# Patient Record
Sex: Male | Born: 1955 | Race: White | Hispanic: No | Smoking: Former smoker
Health system: Southern US, Community
[De-identification: ages and names within clinical notes are randomized; demographics above are authoritative.]

## PROBLEM LIST (undated history)

## (undated) DIAGNOSIS — E119 Type 2 diabetes mellitus without complications: Secondary | ICD-10-CM

## (undated) DIAGNOSIS — Z86718 Personal history of other venous thrombosis and embolism: Secondary | ICD-10-CM

## (undated) HISTORY — DX: Type 2 diabetes mellitus without complications: E11.9

## (undated) HISTORY — PX: LEG SURGERY: SHX1003

## (undated) HISTORY — DX: Personal history of other venous thrombosis and embolism: Z86.718

---

## 2018-10-22 ENCOUNTER — Encounter: Payer: Self-pay | Admitting: Family Medicine

## 2018-10-22 ENCOUNTER — Ambulatory Visit (INDEPENDENT_AMBULATORY_CARE_PROVIDER_SITE_OTHER): Payer: Medicare Other | Admitting: Family Medicine

## 2018-10-22 DIAGNOSIS — Z23 Encounter for immunization: Secondary | ICD-10-CM | POA: Diagnosis not present

## 2018-10-22 DIAGNOSIS — Z86718 Personal history of other venous thrombosis and embolism: Secondary | ICD-10-CM | POA: Diagnosis not present

## 2018-10-22 DIAGNOSIS — I10 Essential (primary) hypertension: Secondary | ICD-10-CM

## 2018-10-22 DIAGNOSIS — E1169 Type 2 diabetes mellitus with other specified complication: Secondary | ICD-10-CM | POA: Insufficient documentation

## 2018-10-22 DIAGNOSIS — R7303 Prediabetes: Secondary | ICD-10-CM | POA: Diagnosis not present

## 2018-10-22 DIAGNOSIS — I152 Hypertension secondary to endocrine disorders: Secondary | ICD-10-CM | POA: Insufficient documentation

## 2018-10-22 LAB — BAYER DCA HB A1C WAIVED: HB A1C (BAYER DCA - WAIVED): 6.8 % (ref <7.0)

## 2018-10-22 MED ORDER — RIVAROXABAN 15 MG PO TABS: 15.0000 mg | ORAL_TABLET | Freq: Every day | ORAL | 1 refills | Status: DC

## 2018-10-22 MED ORDER — METFORMIN HCL 1000 MG PO TABS: 1000.0000 mg | ORAL_TABLET | Freq: Every day | ORAL | 3 refills | Status: DC

## 2018-10-22 MED ORDER — LOSARTAN POTASSIUM 50 MG PO TABS: 50.0000 mg | ORAL_TABLET | Freq: Every day | ORAL | 3 refills | Status: DC

## 2018-10-22 NOTE — Progress Notes (Signed)
BP (!) 167/81   Pulse 77   Temp 97.6 F (36.4 C) (Oral)   Ht 5' 10"  (1.778 m)   Wt 249 lb (112.9 kg)   BMI 35.73 kg/m    Subjective:    Patient ID: David Tate, male    DOB: 1956-05-10, 63 y.o.   MRN: [id number]  HPI: David Tate is a 63 y.o. male presenting on 10/22/2018 for New Patient (Initial Visit) (Patient moved from [place]) and [place]   HPI Prediabetes Patient comes in today for recheck of his diabetes. Patient has been currently taking metformin. Patient is currently on an ACE inhibitor/ARB. Patient has not seen an ophthalmologist this year. Patient denies any issues with their feet.   Hypertension Patient is currently on losartan, and their blood pressure today is 167/81. Patient denies any lightheadedness or dizziness. Patient denies headaches, blurred vision, chest pains, shortness of breath, or weakness. Denies any side effects from medication and is content with current medication.   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  He was evaluated for any clotting disorders but was never found to have any specific 1 but just was told that he is going to be on Xarelto for life because of the previous DVTs.  Currently he denies any issues with this or chest pain or shortness of breath or palpitations.  Relevant past medical, surgical, family and social history reviewed and updated as indicated. Interim medical history since our last visit reviewed. Allergies and medications reviewed and updated.  Review of Systems  Constitutional: Negative for chills and fever.  HENT: Negative for ear pain and tinnitus.   Eyes: Negative for pain.  Respiratory: Negative for cough, shortness of breath and wheezing.   Cardiovascular: Negative for chest pain, palpitations and leg swelling.  Gastrointestinal: Negative for abdominal pain, blood in stool, constipation and diarrhea.  Genitourinary: Negative for dysuria  and hematuria.  Musculoskeletal: Negative for back pain and myalgias.  Skin: Negative for rash.  Neurological: Negative for dizziness, weakness and headaches.  Psychiatric/Behavioral: Negative for suicidal ideas.    Per HPI unless specifically indicated above  Social History   Socioeconomic History  . Marital status: Divorced    Spouse name: Not on file  . Number of children: Not on file  . Years of education: Not on file  . Highest education level: Not on file  Occupational History  . Not on file  Social Needs  . Financial resource strain: Not on file  . Food insecurity:    Worry: Not on file    Inability: Not on file  . Transportation needs:    Medical: Not on file    Non-medical: Not on file  Tobacco Use  . Smoking status: Former Smoker    Packs/day: 1.00    Years: 50.00    Pack years: 50.00    Types: Cigarettes    Last attempt to quit: 01/2017    Years since quitting: 1.7  . Smokeless tobacco: Never Used  . Tobacco comment: e cigarrettes now and coming down off of them  Substance and Sexual Activity  . Alcohol use: Yes    Comment: occ  . Drug use: Never  . Sexual activity: Yes    Comment: married 3.5 years  Lifestyle  . Physical activity:    Days per week: Not on file    Minutes per session: Not on file  . Stress: Not on file  Relationships  . Social connections:    Talks on phone: Not on file    Gets together: Not on file    Attends religious service: Not on file    Active member of club or organization: Not on file    Attends meetings of clubs or organizations: Not on file    Relationship status: Not on file  . Intimate partner violence:    Fear of current or ex partner: Not on file    Emotionally abused: Not on file    Physically abused: Not on file    Forced sexual activity: Not on file  Other Topics Concern  . Not on file  Social History Narrative  . Not on file    Past Surgical History:  Procedure Laterality Date  . LEG SURGERY Left      Family History  Problem Relation Age of Onset  . COPD Mother     Allergies as of 10/22/2018      Reactions   Lisinopril Cough      Medication List       Accurate as of October 22, 2018 10:25 AM. Always use your most recent med list.        losartan 50 MG tablet Commonly known as:  COZAAR Take 1 tablet (50 mg total) by mouth daily.   metFORMIN 1000 MG tablet Commonly known as:  GLUCOPHAGE Take 1 tablet (1,000 mg total) by mouth daily.   Rivaroxaban 15 MG Tabs tablet Commonly known as:  XARELTO Take 1 tablet (15 mg total) by mouth daily with supper.          Objective:    BP (!) 167/81   Pulse 77   Temp 97.6 F (36.4 C) (Oral)   Ht 5' 10"  (1.778 m)   Wt 249 lb (112.9 kg)   BMI 35.73 kg/m   Wt Readings from Last 3 Encounters:  10/22/18 249 lb (112.9 kg)    Physical Exam Vitals signs and nursing note reviewed.  Constitutional:      General: He is not in acute distress.    Appearance: He is well-developed. He is not diaphoretic.  HENT:     Right Ear: External ear normal.     Left Ear: External ear normal.     Nose: Nose normal.     Mouth/Throat:     Pharynx: No oropharyngeal exudate.  Eyes:     General: No scleral icterus.       Right eye: No discharge.     Conjunctiva/sclera: Conjunctivae normal.     Pupils: Pupils are equal, round, and reactive to light.  Neck:     Musculoskeletal: Neck supple.     Thyroid: No thyromegaly.  Cardiovascular:     Rate and Rhythm: Normal rate and regular rhythm.     Heart sounds: Normal heart sounds. No murmur.  Pulmonary:     Effort: Pulmonary effort is normal. No respiratory distress.     Breath sounds: Normal breath sounds. No wheezing.  Abdominal:     General: Abdomen is flat. Bowel sounds are normal. There is no distension.     Palpations: Abdomen is soft.     Tenderness: There is no abdominal tenderness. There is no guarding or rebound.  Musculoskeletal: Normal range of motion.  Lymphadenopathy:      Cervical: No cervical adenopathy.  Skin:    General: Skin is warm and dry.     Findings: No rash.  Neurological:     Mental Status: He is alert  and oriented to person, place, and time.     Coordination: Coordination normal.  Psychiatric:        Behavior: Behavior normal.     No results found for this or any previous visit.    Assessment & Plan:   Problem List Items Addressed This Visit      Cardiovascular and Mediastinum   Hypertension   Relevant Medications   Rivaroxaban (XARELTO) 15 MG TABS tablet   Other Relevant Orders   CBC with Differential/Platelet (Completed)   CMP14+EGFR (Completed)   BMP8+EGFR     Other   Prediabetes   Relevant Orders   Bayer DCA Hb A1c Waived (Completed)   History of DVT (deep vein thrombosis)   Relevant Orders   CBC with Differential/Platelet (Completed)   Morbid obesity (HCC)   Relevant Medications   metFORMIN (GLUCOPHAGE) 1000 MG tablet   Other Relevant Orders   Lipid panel (Completed)    Other Visit Diagnoses    Encounter for immunization       Relevant Orders   Flu vaccine HIGH DOSE PF (Completed)      Patient does have morbid obesity and weight and lifestyle changes were discussed and patient will try to make some changes  As a new patient we will continue his Xarelto and his metformin we will check his A1c and other labs today. Follow up plan: Return in about 3 months (around 01/21/2019), or if symptoms worsen or fail to improve, for Diabetes.  Caryl Pina, MD [facility] 10/22/2018, 10:25 AM

## 2018-10-22 NOTE — Patient Instructions (Signed)
Return in 4 weeks for lab draw and 3 months for regular check

## 2018-10-23 LAB — LIPID PANEL
Chol/HDL Ratio: 5.6 ratio — ABNORMAL HIGH (ref 0.0–5.0)
Cholesterol, Total: 196 mg/dL (ref 100–199)
HDL: 35 mg/dL — AB (ref >39)
LDL Calculated: 112 mg/dL — ABNORMAL HIGH (ref 0–99)
Triglycerides: 246 mg/dL — ABNORMAL HIGH (ref 0–149)
VLDL Cholesterol Cal: 49 mg/dL — ABNORMAL HIGH (ref 5–40)

## 2018-10-23 LAB — CMP14+EGFR
ALT: 37 IU/L (ref 0–44)
AST: 27 IU/L (ref 0–40)
Albumin/Globulin Ratio: 1.5 (ref 1.2–2.2)
Albumin: 4.5 g/dL (ref 3.6–4.8)
Alkaline Phosphatase: 109 IU/L (ref 39–117)
BUN/Creatinine Ratio: 13 (ref 10–24)
BUN: 14 mg/dL (ref 8–27)
Bilirubin Total: 0.3 mg/dL (ref 0.0–1.2)
CALCIUM: 10.2 mg/dL (ref 8.6–10.2)
CO2: 22 mmol/L (ref 20–29)
Chloride: 100 mmol/L (ref 96–106)
Creatinine, Ser: 1.1 mg/dL (ref 0.76–1.27)
GFR calc Af Amer: 81 mL/min/{1.73_m2} (ref >59)
GFR, EST NON AFRICAN AMERICAN: 70 mL/min/{1.73_m2} (ref >59)
Globulin, Total: 3.1 g/dL (ref 1.5–4.5)
Glucose: 114 mg/dL — ABNORMAL HIGH (ref 65–99)
Potassium: 5 mmol/L (ref 3.5–5.2)
Sodium: 140 mmol/L (ref 134–144)
Total Protein: 7.6 g/dL (ref 6.0–8.5)

## 2018-10-23 LAB — CBC WITH DIFFERENTIAL/PLATELET
Basophils Absolute: 0.2 10*3/uL (ref 0.0–0.2)
Basos: 1 %
EOS (ABSOLUTE): 0.2 10*3/uL (ref 0.0–0.4)
Eos: 1 %
Hematocrit: 47.7 % (ref 37.5–51.0)
Hemoglobin: 16.9 g/dL (ref 13.0–17.7)
Immature Grans (Abs): 0.7 10*3/uL — ABNORMAL HIGH (ref 0.0–0.1)
Immature Granulocytes: 4 %
Lymphocytes Absolute: 4.5 10*3/uL — ABNORMAL HIGH (ref 0.7–3.1)
Lymphs: 25 %
MCH: 29.4 pg (ref 26.6–33.0)
MCHC: 35.4 g/dL (ref 31.5–35.7)
MCV: 83 fL (ref 79–97)
Monocytes Absolute: 1.1 10*3/uL — ABNORMAL HIGH (ref 0.1–0.9)
Monocytes: 6 %
Neutrophils Absolute: 11.2 10*3/uL — ABNORMAL HIGH (ref 1.4–7.0)
Neutrophils: 63 %
Platelets: 338 10*3/uL (ref 150–450)
RBC: 5.74 x10E6/uL (ref 4.14–5.80)
RDW: 12.3 % (ref 11.6–15.4)
WBC: 18 10*3/uL — ABNORMAL HIGH (ref 3.4–10.8)

## 2018-10-26 ENCOUNTER — Telehealth: Payer: Self-pay | Admitting: *Deleted

## 2018-10-26 NOTE — Telephone Encounter (Signed)
Fax from [facility] Losartan 50 mg is on backorder Request alternative 100 mg 1/2 tab QD Please advise and send in new Rx

## 2018-10-27 ENCOUNTER — Other Ambulatory Visit: Payer: Self-pay | Admitting: *Deleted

## 2018-10-27 ENCOUNTER — Encounter: Payer: Self-pay | Admitting: *Deleted

## 2018-10-27 MED ORDER — LOSARTAN POTASSIUM 100 MG PO TABS: 50.0000 mg | ORAL_TABLET | Freq: Every day | ORAL | 1 refills | Status: DC

## 2018-10-27 NOTE — Telephone Encounter (Signed)
Yes we can go ahead and send in the 100 mg so he can half it.  Give him a months worth of that and hopefully will be back next month

## 2018-10-27 NOTE — Telephone Encounter (Signed)
Phone number for patient does not work.  Letter mailed explaining change in losartan script.  New script is 100 mg , take I/2 pill daily.

## 2018-10-29 ENCOUNTER — Encounter: Payer: Self-pay | Admitting: *Deleted

## 2018-11-05 ENCOUNTER — Other Ambulatory Visit: Payer: Self-pay

## 2018-11-05 MED ORDER — PRAVASTATIN SODIUM 20 MG PO TABS: 20.0000 mg | ORAL_TABLET | Freq: Every day | ORAL | 1 refills | Status: DC

## 2018-12-03 ENCOUNTER — Other Ambulatory Visit: Payer: Medicare Other

## 2018-12-03 DIAGNOSIS — I10 Essential (primary) hypertension: Secondary | ICD-10-CM

## 2018-12-04 LAB — BMP8+EGFR
BUN/Creatinine Ratio: 17 (ref 10–24)
BUN: 19 mg/dL (ref 8–27)
CO2: 18 mmol/L — ABNORMAL LOW (ref 20–29)
Calcium: 9.5 mg/dL (ref 8.6–10.2)
Chloride: 100 mmol/L (ref 96–106)
Creatinine, Ser: 1.15 mg/dL (ref 0.76–1.27)
GFR calc Af Amer: 77 mL/min/{1.73_m2} (ref >59)
GFR, EST NON AFRICAN AMERICAN: 66 mL/min/{1.73_m2} (ref >59)
Glucose: 163 mg/dL — ABNORMAL HIGH (ref 65–99)
Potassium: 4.5 mmol/L (ref 3.5–5.2)
Sodium: 140 mmol/L (ref 134–144)

## 2018-12-09 ENCOUNTER — Ambulatory Visit (INDEPENDENT_AMBULATORY_CARE_PROVIDER_SITE_OTHER): Payer: Medicare Other | Admitting: Family Medicine

## 2018-12-09 ENCOUNTER — Encounter: Payer: Self-pay | Admitting: Family Medicine

## 2018-12-09 VITALS — BP 132/76 | HR 76 | Temp 97.0°F | Ht 70.0 in | Wt 260.4 lb

## 2018-12-09 DIAGNOSIS — Z0000 Encounter for general adult medical examination without abnormal findings: Secondary | ICD-10-CM

## 2018-12-09 DIAGNOSIS — Z1211 Encounter for screening for malignant neoplasm of colon: Secondary | ICD-10-CM

## 2018-12-09 NOTE — Progress Notes (Signed)
Subjective:   David Tate is a 63 y.o. male who presents for a Welcome to Medicare exam.   Review of Systems: The 10-year ASCVD risk score Mikey Bussing DC Brooke Bonito., et al., 2013) is: 18.7%   Values used to calculate the score:     Age: 46 years     Sex: Male     Is Non-Hispanic African American: No     Diabetic: No     Tobacco smoker: No     Systolic Blood Pressure: 643 mmHg     Is BP treated: Yes     HDL Cholesterol: 35 mg/dL     Total Cholesterol: 196 mg/dL  Cardiac Risk Factors include: advanced age (>66men, >43 women);diabetes mellitus;obesity (BMI >30kg/m2);hypertension;male gender     Objective:    Today's Vitals   12/09/18 1009  BP: 132/76  Pulse: 76  Temp: (!) 97 F (36.1 C)  TempSrc: Oral  Weight: 260 lb 6.4 oz (118.1 kg)  Height: 5\' 10"  (1.778 m)   Body mass index is 37.36 kg/m.  Medications Outpatient Encounter Medications as of 12/09/2018  Medication Sig  . losartan (COZAAR) 100 MG tablet Take 0.5 tablets (50 mg total) by mouth daily.  . metFORMIN (GLUCOPHAGE) 1000 MG tablet Take 1 tablet (1,000 mg total) by mouth daily.  . pravastatin (PRAVACHOL) 20 MG tablet Take 1 tablet (20 mg total) by mouth daily.  . Rivaroxaban (XARELTO) 15 MG TABS tablet Take 1 tablet (15 mg total) by mouth daily with supper.   No facility-administered encounter medications on file as of 12/09/2018.      History: Past Medical History:  Diagnosis Date  . Diabetes mellitus without complication ([place])   . H/O blood clots    Past Surgical History:  Procedure Laterality Date  . LEG SURGERY Left     Family History  Problem Relation Age of Onset  . COPD Mother    Social History   Occupational History  . Not on file  Tobacco Use  . Smoking status: Former Smoker    Packs/day: 1.00    Years: 50.00    Pack years: 50.00    Types: Cigarettes    Last attempt to quit: 01/2017    Years since quitting: 1.9  . Smokeless tobacco: Never Used  . Tobacco comment: e cigarrettes now and coming  down off of them  Substance and Sexual Activity  . Alcohol use: Yes    Comment: occ  . Drug use: Never  . Sexual activity: Yes    Comment: married 3.5 years   Tobacco Counseling Counseling given: Not Answered Comment: e cigarrettes now and coming down off of them   Immunizations and Health Maintenance Immunization History  Administered Date(s) Administered  . Influenza, High Dose Seasonal PF 10/22/2018   Health Maintenance Due  Topic Date Due  . Hepatitis C Screening  1955-11-20  . HIV Screening  10/10/1968  . TETANUS/TDAP  10/10/1972  . COLONOSCOPY  10/11/2003  . PNA vac Low Risk Adult (1 of 2 - PCV13) 10/10/2018    Activities of Daily Living In your present state of health, do you have any difficulty performing the following activities: 12/09/2018  Hearing? N  Vision? N  Difficulty concentrating or making decisions? N  Walking or climbing stairs? Y  Comment lower back pain  Dressing or bathing? N  Doing errands, shopping? N  Preparing Food and eating ? N  Using the Toilet? N  In the past six months, have you accidently leaked urine? N  Do you have problems with loss of bowel control? N  Managing your Medications? N  Managing your Finances? N  Housekeeping or managing your Housekeeping? N    Physical Exam  (optional), or other factors deemed appropriate based on the beneficiary's medical and social history and current clinical standards.  Advanced Directives: Does Patient Have a Medical Advance Directive?: No    Assessment:    This is a routine wellness  examination for this patient .   Vision/Hearing screen No exam data present  Dietary issues and exercise activities discussed:  Current Exercise Habits: Home exercise routine, Type of exercise: walking, Time (Minutes): 30, Frequency (Times/Week): 7, Weekly Exercise (Minutes/Week): 210, Intensity: Moderate  Goals   None     Depression Screen PHQ 2/9 Scores 12/09/2018 10/22/2018  PHQ - 2 Score 0 0      Fall Risk No flowsheet data found.  Cognitive Function MMSE - Mini Mental State Exam 12/09/2018  Orientation to time 5  Orientation to Place 4  Registration 3  Attention/ Calculation 5  Recall 1  Language- name 2 objects 2  Language- repeat 1  Language- follow 3 step command 1  Language- read & follow direction 1  Write a sentence 1  Copy design 1  Total score 25        Patient Care Team: Tavita Eastham, Fransisca Kaufmann, MD as PCP - General (Family Medicine)     Plan:     Problem List Items Addressed This Visit    None    Visit Diagnoses    Encounter for Medicare annual wellness exam    -  Primary   Colon cancer screening       Relevant Orders   Cologuard       I have personally reviewed and noted the following in the patient's chart:   . Medical and social history . Use of alcohol, tobacco or illicit drugs  . Current medications and supplements . Functional ability and status . Nutritional status . Physical activity . Advanced directives . List of other physicians . Hospitalizations, surgeries, and ER visits in previous 12 months . Vitals . Screenings to include cognitive, depression, and falls . Referrals and appointments  In addition, I have reviewed and discussed with patient certain preventive protocols, quality metrics, and best practice recommendations. A written personalized care plan for preventive services as well as general preventive health recommendations were provided to patient.    Worthy Rancher, MD 12/09/2018

## 2018-12-21 DIAGNOSIS — Z1211 Encounter for screening for malignant neoplasm of colon: Secondary | ICD-10-CM | POA: Diagnosis not present

## 2018-12-21 DIAGNOSIS — Z1212 Encounter for screening for malignant neoplasm of rectum: Secondary | ICD-10-CM | POA: Diagnosis not present

## 2018-12-24 LAB — COLOGUARD: Cologuard: NEGATIVE

## 2019-01-26 ENCOUNTER — Other Ambulatory Visit: Payer: Self-pay

## 2019-01-26 ENCOUNTER — Ambulatory Visit (INDEPENDENT_AMBULATORY_CARE_PROVIDER_SITE_OTHER): Payer: Medicare Other | Admitting: Family Medicine

## 2019-01-26 ENCOUNTER — Encounter: Payer: Self-pay | Admitting: Family Medicine

## 2019-01-26 DIAGNOSIS — F410 Panic disorder [episodic paroxysmal anxiety] without agoraphobia: Secondary | ICD-10-CM

## 2019-01-26 DIAGNOSIS — R7303 Prediabetes: Secondary | ICD-10-CM | POA: Diagnosis not present

## 2019-01-26 DIAGNOSIS — E1169 Type 2 diabetes mellitus with other specified complication: Secondary | ICD-10-CM

## 2019-01-26 DIAGNOSIS — I10 Essential (primary) hypertension: Secondary | ICD-10-CM

## 2019-01-26 DIAGNOSIS — Z86718 Personal history of other venous thrombosis and embolism: Secondary | ICD-10-CM

## 2019-01-26 DIAGNOSIS — E785 Hyperlipidemia, unspecified: Secondary | ICD-10-CM | POA: Diagnosis not present

## 2019-01-26 MED ORDER — METFORMIN HCL 1000 MG PO TABS: 1000.0000 mg | ORAL_TABLET | Freq: Every day | ORAL | 3 refills | Status: DC

## 2019-01-26 MED ORDER — HYDROXYZINE HCL 25 MG PO TABS: 25.0000 mg | ORAL_TABLET | Freq: Three times a day (TID) | ORAL | 1 refills | Status: DC | PRN

## 2019-01-26 MED ORDER — PRAVASTATIN SODIUM 20 MG PO TABS: 20.0000 mg | ORAL_TABLET | Freq: Every day | ORAL | 1 refills | Status: DC

## 2019-01-26 MED ORDER — LOSARTAN POTASSIUM 50 MG PO TABS: 100.0000 mg | ORAL_TABLET | Freq: Every day | ORAL | 3 refills | Status: DC

## 2019-01-26 MED ORDER — RIVAROXABAN 15 MG PO TABS: 15.0000 mg | ORAL_TABLET | Freq: Every day | ORAL | 1 refills | Status: DC

## 2019-01-26 MED ORDER — METFORMIN HCL 1000 MG PO TABS: 1000.0000 mg | ORAL_TABLET | Freq: Two times a day (BID) | ORAL | 3 refills | Status: DC

## 2019-01-26 NOTE — Progress Notes (Signed)
Virtual Visit via telephone Note  I connected with David Tate on 01/26/19 at 0941 by telephone and verified that I am speaking with the correct person using two identifiers. David Tate is currently located at home and wife are currently with her during visit. The provider, Fransisca Kaufmann Calloway Andrus, MD is located in their office at time of visit.  Call ended at 1002  I discussed the limitations, risks, security and privacy concerns of performing an evaluation and management service by telephone and the availability of in person appointments. I also discussed with the patient that there may be a patient responsible charge related to this service. The patient expressed understanding and agreed to proceed.   History and Present Illness: Hypertension Patient is currently on losartan 50, and their blood pressure today is 135/81 and more into 676'H systolic. Patient denies any lightheadedness or dizziness. Patient denies headaches, blurred vision, chest pains, shortness of breath, or weakness. Denies any side effects from medication and is content with current medication.   Prediabetes Patient comes in today for recheck of his diabetes. Patient has been currently taking metformin. Patient is currently on an ACE inhibitor/ARB. Patient has not seen an ophthalmologist this year. Patient denies any issues with their feet.   Hyperlipidemia Patient is coming in for recheck of his hyperlipidemia. The patient is currently taking pravastatin. They deny any issues with myalgias or history of liver damage from it. They deny any focal numbness or weakness or chest pain.   Anxiety  Patient has been feeling anxiety and he will be relaxing and commonly occurs at rest. Patient denies chest pain.  It will occur at rest.  Patient says the anxiety will build up and then he feels little bit or short of breath with it and then it will pass and then will build up again and symptoms will happen a few times in the day and then  he will go days or week without it.  He says a lot of it he feels is related to the coronavirus building up currently.  Patient denies any suicidal ideations or thoughts of hurting himself.  No diagnosis found.  Outpatient Encounter Medications as of 01/26/2019  Medication Sig  . losartan (COZAAR) 100 MG tablet Take 0.5 tablets (50 mg total) by mouth daily.  . metFORMIN (GLUCOPHAGE) 1000 MG tablet Take 1 tablet (1,000 mg total) by mouth daily.  . pravastatin (PRAVACHOL) 20 MG tablet Take 1 tablet (20 mg total) by mouth daily.  . Rivaroxaban (XARELTO) 15 MG TABS tablet Take 1 tablet (15 mg total) by mouth daily with supper.   No facility-administered encounter medications on file as of 01/26/2019.     Review of Systems  Constitutional: Negative for chills and fever.  Respiratory: Positive for shortness of breath. Negative for wheezing.   Cardiovascular: Negative for chest pain and leg swelling.  Musculoskeletal: Negative for back pain and gait problem.  Skin: Negative for rash.  Neurological: Negative for dizziness, weakness and light-headedness.  Psychiatric/Behavioral: Positive for sleep disturbance. Negative for decreased concentration, dysphoric mood, self-injury and suicidal ideas. The patient is nervous/anxious.   All other systems reviewed and are negative.   Observations/Objective: Patient sounds comfortable and in no acute distress  Assessment and Plan: Problem List Items Addressed This Visit      Cardiovascular and Mediastinum   Hypertension   Relevant Medications   losartan (COZAAR) 50 MG tablet   pravastatin (PRAVACHOL) 20 MG tablet   Rivaroxaban (XARELTO) 15 MG TABS tablet  Other   Prediabetes - Primary   Relevant Medications   metFORMIN (GLUCOPHAGE) 1000 MG tablet   History of DVT (deep vein thrombosis)   Relevant Medications   Rivaroxaban (XARELTO) 15 MG TABS tablet    Other Visit Diagnoses    Hyperlipidemia associated with type 2 diabetes mellitus (HCC)        Relevant Medications   losartan (COZAAR) 50 MG tablet   pravastatin (PRAVACHOL) 20 MG tablet   metFORMIN (GLUCOPHAGE) 1000 MG tablet   Anxiety attack       Relevant Medications   hydrOXYzine (ATARAX/VISTARIL) 25 MG tablet       Follow Up Instructions: Will start hydroxyzine as needed for anxiety.  Continue his metformin and pravastatin and Xarelto.  Increased his losartan to 100 mg daily at 2 tablets and he will monitor closely at home  Follow-up in 3 months    I discussed the assessment and treatment plan with the patient. The patient was provided an opportunity to ask questions and all were answered. The patient agreed with the plan and demonstrated an understanding of the instructions.   The patient was advised to call back or seek an in-person evaluation if the symptoms worsen or if the condition fails to improve as anticipated.  The above assessment and management plan was discussed with the patient. The patient verbalized understanding of and has agreed to the management plan. Patient is aware to call the clinic if symptoms persist or worsen. Patient is aware when to return to the clinic for a follow-up visit. Patient educated on when it is appropriate to go to the emergency department.    I provided 21 minutes of non-face-to-face time during this encounter.    Worthy Rancher, MD

## 2019-03-14 ENCOUNTER — Other Ambulatory Visit: Payer: Self-pay

## 2019-03-14 ENCOUNTER — Other Ambulatory Visit: Payer: Self-pay | Admitting: Family Medicine

## 2019-03-14 ENCOUNTER — Ambulatory Visit (HOSPITAL_COMMUNITY)
Admission: RE | Admit: 2019-03-14 | Discharge: 2019-03-14 | Disposition: A | Discharge status: Home / Self Care | Payer: Medicare Other | Source: Ambulatory Visit | Attending: Family Medicine | Admitting: Family Medicine

## 2019-03-14 ENCOUNTER — Encounter: Payer: Self-pay | Admitting: Family Medicine

## 2019-03-14 ENCOUNTER — Ambulatory Visit (INDEPENDENT_AMBULATORY_CARE_PROVIDER_SITE_OTHER): Payer: Medicare Other | Admitting: Family Medicine

## 2019-03-14 VITALS — BP 148/79 | HR 79 | Temp 97.7°F | Ht 70.0 in | Wt 262.8 lb

## 2019-03-14 DIAGNOSIS — R600 Localized edema: Secondary | ICD-10-CM | POA: Diagnosis not present

## 2019-03-14 DIAGNOSIS — R2242 Localized swelling, mass and lump, left lower limb: Secondary | ICD-10-CM

## 2019-03-14 DIAGNOSIS — L03116 Cellulitis of left lower limb: Secondary | ICD-10-CM

## 2019-03-14 MED ORDER — DOXYCYCLINE HYCLATE 100 MG PO TABS: 100.0000 mg | ORAL_TABLET | Freq: Two times a day (BID) | ORAL | 0 refills | Status: DC

## 2019-03-14 NOTE — Patient Instructions (Signed)

## 2019-03-14 NOTE — Progress Notes (Signed)
Subjective:  Patient ID: Cartrell Bentsen, male    DOB: 14-May-1956, 63 y.o.   MRN: [id number]  Chief Complaint:  Leg Swelling (left lower leg- x 3 days. Also c/o hot to touch and painful . 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 male presenting on 03/14/2019 for Leg Swelling (left lower leg- x 3 days. Also c/o hot to touch and painful . States he has hx of DVT)  Pt presents today with complaints of left lower extremity swelling, tenderness, erythema, and increased warmth. Pt states this started on Friday. No known injury. No recent long travel. Does have a sedentary job. No fever, chills, weakness, palpitations, cough, chest pain, shortness of breath, dizziness, or syncope.   Relevant past medical, surgical, family, and social history reviewed and updated as indicated.  Allergies and medications reviewed and updated.   Past Medical History:  Diagnosis Date  . Diabetes mellitus without complication ([place])   . H/O blood clots     Past Surgical History:  Procedure Laterality Date  . LEG SURGERY Left     Social History   Socioeconomic History  . Marital status: Divorced    Spouse name: Not on file  . Number of children: Not on file  . Years of education: Not on file  . Highest education level: Not on file  Occupational History  . Not on file  Social Needs  . Financial resource strain: Not on file  . Food insecurity:    Worry: Not on file    Inability: Not on file  . Transportation needs:    Medical: Not on file    Non-medical: Not on file  Tobacco Use  . Smoking status: Former Smoker    Packs/day: 1.00    Years: 50.00    Pack years: 50.00    Types: Cigarettes    Last attempt to quit: 01/2017    Years since quitting: 2.1  . Smokeless tobacco: Never Used  . Tobacco comment: e cigarrettes now and coming down off of them  Substance and Sexual Activity  . Alcohol use: Yes    Comment: occ  . Drug use: Never  . Sexual activity: Yes    Comment: married 3.5  years  Lifestyle  . Physical activity:    Days per week: Not on file    Minutes per session: Not on file  . Stress: Not on file  Relationships  . Social connections:    Talks on phone: Not on file    Gets together: Not on file    Attends religious service: Not on file    Active member of club or organization: Not on file    Attends meetings of clubs or organizations: Not on file    Relationship status: Not on file  . Intimate partner violence:    Fear of current or ex partner: Not on file    Emotionally abused: Not on file    Physically abused: Not on file    Forced sexual activity: Not on file  Other Topics Concern  . Not on file  Social History Narrative  . Not on file    Outpatient Encounter Medications as of 03/14/2019  Medication Sig  . hydrOXYzine (ATARAX/VISTARIL) 25 MG tablet Take 1 tablet (25 mg total) by mouth 3 (three) times daily as needed for anxiety.  Marland Kitchen losartan (COZAAR) 50 MG tablet Take 2 tablets (100 mg total) by mouth daily.  . metFORMIN (GLUCOPHAGE) 1000 MG  tablet Take 1 tablet (1,000 mg total) by mouth daily with breakfast.  . pravastatin (PRAVACHOL) 20 MG tablet Take 1 tablet (20 mg total) by mouth daily.  . Rivaroxaban (XARELTO) 15 MG TABS tablet Take 1 tablet (15 mg total) by mouth daily with supper.  . doxycycline (VIBRA-TABS) 100 MG tablet Take 1 tablet (100 mg total) by mouth 2 (two) times daily for 7 days. 1 po bid   No facility-administered encounter medications on file as of 03/14/2019.     Allergies  Allergen Reactions  . Lisinopril Cough    Review of Systems  Constitutional: Negative for chills, diaphoresis, fatigue and fever.  Respiratory: Negative for cough, chest tightness, shortness of breath and wheezing.   Cardiovascular: Positive for leg swelling. Negative for chest pain and palpitations.  Gastrointestinal: Negative for abdominal distention.  Genitourinary: Negative for decreased urine volume.  Musculoskeletal: Negative for  arthralgias, joint swelling and myalgias.  Neurological: Negative for dizziness, syncope, weakness, light-headedness and headaches.  Psychiatric/Behavioral: Negative for confusion.  All other systems reviewed and are negative.       Objective:  BP (!) 148/79   Pulse 79   Temp 97.7 F (36.5 C) (Oral)   Ht 5\' 10"  (1.778 m)   Wt 262 lb 12.8 oz (119.2 kg)   BMI 37.71 kg/m    Wt Readings from Last 3 Encounters:  03/14/19 262 lb 12.8 oz (119.2 kg)  12/09/18 260 lb 6.4 oz (118.1 kg)  10/22/18 249 lb (112.9 kg)    Physical Exam Vitals signs and nursing note reviewed.  Constitutional:      General: He is not in acute distress.    Appearance: Normal appearance. He is well-developed and well-groomed. He is not ill-appearing, toxic-appearing or diaphoretic.  HENT:     Head: Normocephalic and atraumatic.     Jaw: There is normal jaw occlusion.     Right Ear: Hearing normal.     Left Ear: Hearing normal.     Nose: Nose normal.     Mouth/Throat:     Lips: Pink.     Mouth: Mucous membranes are moist.     Pharynx: Oropharynx is clear. Uvula midline.  Eyes:     General: Lids are normal.     Extraocular Movements: Extraocular movements intact.     Conjunctiva/sclera: Conjunctivae normal.     Pupils: Pupils are equal, round, and reactive to light.  Neck:     Musculoskeletal: Normal range of motion and neck supple.     Thyroid: No thyroid mass, thyromegaly or thyroid tenderness.     Vascular: No carotid bruit or JVD.     Trachea: Trachea and phonation normal.  Cardiovascular:     Rate and Rhythm: Normal rate and regular rhythm.     Chest Wall: PMI is not displaced.     Pulses: Normal pulses.     Heart sounds: Normal heart sounds. No murmur. No friction rub. No gallop.      Comments: Right calf 38 cm Left calf 41.5 cm Pulmonary:     Effort: Pulmonary effort is normal. No respiratory distress.     Breath sounds: Normal breath sounds. No wheezing.  Abdominal:     General: Bowel  sounds are normal. There is no distension or abdominal bruit.     Palpations: Abdomen is soft. There is no hepatomegaly or splenomegaly.     Tenderness: There is no abdominal tenderness. There is no right CVA tenderness or left CVA tenderness.     Hernia: No  hernia is present.  Musculoskeletal: Normal range of motion.     Right lower leg: No edema.     Left lower leg: 2+ Edema present.  Lymphadenopathy:     Cervical: No cervical adenopathy.  Skin:    General: Skin is warm and dry.     Capillary Refill: Capillary refill takes less than 2 seconds.     Coloration: Skin is not cyanotic, jaundiced or pale.     Findings: Erythema and wound present. No rash.       Neurological:     General: No focal deficit present.     Mental Status: He is alert and oriented to person, place, and time.     Cranial Nerves: Cranial nerves are intact.     Sensory: Sensation is intact.     Motor: Motor function is intact.     Coordination: Coordination is intact.     Gait: Gait is intact.     Deep Tendon Reflexes: Reflexes are normal and symmetric.  Psychiatric:        Attention and Perception: Attention and perception normal.        Mood and Affect: Mood and affect normal.        Speech: Speech normal.        Behavior: Behavior normal. Behavior is cooperative.        Thought Content: Thought content normal.        Cognition and Memory: Cognition and memory normal.        Judgment: Judgment normal.     Results for orders placed or performed in visit on 12/28/18  Cologuard  Result Value Ref Range   Cologuard Negative Negative       Pertinent labs & imaging results that were available during my care of the patient were reviewed by me and considered in my medical decision making.  Assessment & Plan:  Marcques was seen today for leg swelling.  Diagnoses and all orders for this visit:  Localized swelling of left lower extremity Likely cellulitis but due to the size differences in calf circumference  and history of DVT, will get [place].  -     Cancel: VAS [place] LOWER EXTREMITY VENOUS (DVT); Future -     US Venous Img Lower Unilateral Left; Future  Cellulitis of left lower extremity Erythema, swelling, and increased warmth to left lower extremity. Pt does have a small abrasion to anterior shin, likely point of entry. Will treat with below. Symptomatic care discussed. Medications as prescribed. Report any new or worsening symptoms. Follow up in 1 week for reevaluation.  -     doxycycline (VIBRA-TABS) 100 MG tablet; Take 1 tablet (100 mg total) by mouth 2 (two) times daily for 7 days. 1 po bid     Continue all other maintenance medications.  Follow up plan: Return in about 1 week (around 03/21/2019), or if symptoms worsen or fail to improve.  Educational handout given for cellulitis  The above assessment and management plan was discussed with the patient. The patient verbalized understanding of and has agreed to the management plan. Patient is aware to call the clinic if symptoms persist or worsen. Patient is aware when to return to the clinic for a follow-up visit. Patient educated on when it is appropriate to go to the emergency department.   Monia Pouch, FNP-C [facility] [phone]

## 2019-03-21 ENCOUNTER — Ambulatory Visit (INDEPENDENT_AMBULATORY_CARE_PROVIDER_SITE_OTHER): Payer: Medicare Other | Admitting: Family Medicine

## 2019-03-21 ENCOUNTER — Other Ambulatory Visit: Payer: Self-pay

## 2019-03-21 ENCOUNTER — Encounter: Payer: Self-pay | Admitting: Family Medicine

## 2019-03-21 VITALS — BP 111/61 | HR 73 | Temp 98.1°F | Ht 70.0 in | Wt 264.2 lb

## 2019-03-21 DIAGNOSIS — L03116 Cellulitis of left lower limb: Secondary | ICD-10-CM

## 2019-03-21 DIAGNOSIS — R2242 Localized swelling, mass and lump, left lower limb: Secondary | ICD-10-CM | POA: Diagnosis not present

## 2019-03-21 DIAGNOSIS — D72829 Elevated white blood cell count, unspecified: Secondary | ICD-10-CM | POA: Diagnosis not present

## 2019-03-21 DIAGNOSIS — I872 Venous insufficiency (chronic) (peripheral): Secondary | ICD-10-CM | POA: Diagnosis not present

## 2019-03-21 MED ORDER — SILVER SULFADIAZINE 1 % EX CREA: 1.0000 "application " | TOPICAL_CREAM | Freq: Every day | CUTANEOUS | 0 refills | Status: DC

## 2019-03-21 NOTE — Progress Notes (Signed)
Subjective:  Patient ID: David Tate, male    DOB: 04-08-1956, 63 y.o.   MRN: [id number]  Chief Complaint:  Cellulitis (1 week follow up )   HPI: David Tate is a 63 y.o. male presenting on 03/21/2019 for Cellulitis (1 week follow up )  Pt presents today for follow up of left lower leg swelling and cellulitis. Pt states the redness has almost completely resolved. States he still has some tenderness. No new injuries. Has completed antibiotic therapy. No fever, chills, weakness, or confusion. No chest pain, palpitations, or shortness of breath.   Relevant past medical, surgical, family, and social history reviewed and updated as indicated.  Allergies and medications reviewed and updated.   Past Medical History:  Diagnosis Date   Diabetes mellitus without complication ([place])    H/O blood clots     Past Surgical History:  Procedure Laterality Date   LEG SURGERY Left     Social History   Socioeconomic History   Marital status: Divorced    Spouse name: Not on file   Number of children: Not on file   Years of education: Not on file   Highest education level: Not on file  Occupational History   Not on file  Social Needs   Financial resource strain: Not on file   Food insecurity    Worry: Not on file    Inability: Not on file   Transportation needs    Medical: Not on file    Non-medical: Not on file  Tobacco Use   Smoking status: Former Smoker    Packs/day: 1.00    Years: 50.00    Pack years: 50.00    Types: Cigarettes    Quit date: 01/2017    Years since quitting: 2.2   Smokeless tobacco: Never Used   Tobacco comment: e cigarrettes now and coming down off of them  Substance and Sexual Activity   Alcohol use: Yes    Comment: occ   Drug use: Never   Sexual activity: Yes    Comment: married 3.5 years  Lifestyle   Physical activity    Days per week: Not on file    Minutes per session: Not on file   Stress: Not on file  Relationships   Social  connections    Talks on phone: Not on file    Gets together: Not on file    Attends religious service: Not on file    Active member of club or organization: Not on file    Attends meetings of clubs or organizations: Not on file    Relationship status: Not on file   Intimate partner violence    Fear of current or ex partner: Not on file    Emotionally abused: Not on file    Physically abused: Not on file    Forced sexual activity: Not on file  Other Topics Concern   Not on file  Social History Narrative   Not on file    Outpatient Encounter Medications as of 03/21/2019  Medication Sig   losartan (COZAAR) 50 MG tablet Take 2 tablets (100 mg total) by mouth daily.   metFORMIN (GLUCOPHAGE) 1000 MG tablet Take 1 tablet (1,000 mg total) by mouth daily with breakfast.   pravastatin (PRAVACHOL) 20 MG tablet Take 1 tablet (20 mg total) by mouth daily.   Rivaroxaban (XARELTO) 15 MG TABS tablet Take 1 tablet (15 mg total) by mouth daily with supper.   [DISCONTINUED] doxycycline (VIBRA-TABS) 100 MG tablet Take  1 tablet (100 mg total) by mouth 2 (two) times daily for 7 days. 1 po bid   silver sulfADIAZINE (SILVADENE) 1 % cream Apply 1 application topically daily.   [DISCONTINUED] hydrOXYzine (ATARAX/VISTARIL) 25 MG tablet Take 1 tablet (25 mg total) by mouth 3 (three) times daily as needed for anxiety. (Patient not taking: Reported on 03/21/2019)   No facility-administered encounter medications on file as of 03/21/2019.     Allergies  Allergen Reactions   Lisinopril Cough    Review of Systems  Constitutional: Negative for chills, fatigue, fever and unexpected weight change.  Respiratory: Negative for cough, chest tightness and shortness of breath.   Cardiovascular: Positive for leg swelling. Negative for chest pain and palpitations.  Gastrointestinal: Negative for abdominal distention.  Genitourinary: Negative for decreased urine volume and difficulty urinating.  Musculoskeletal:  Positive for myalgias.  Skin: Positive for color change. Negative for pallor, rash and wound.  Neurological: Negative for dizziness, tremors, seizures, syncope, facial asymmetry, speech difficulty, weakness, light-headedness, numbness and headaches.  Psychiatric/Behavioral: Negative for confusion.  All other systems reviewed and are negative.       Objective:  BP 111/61    Pulse 73    Temp 98.1 F (36.7 C) (Oral)    Ht _0  (1.778 m)    Wt 264 lb 3.2 oz (119.8 kg)    BMI 37.91 kg/m    Wt Readings from Last 3 Encounters:  03/21/19 264 lb 3.2 oz (119.8 kg)  03/14/19 262 lb 12.8 oz (119.2 kg)  12/09/18 260 lb 6.4 oz (118.1 kg)    Physical Exam Vitals signs and nursing note reviewed.  Constitutional:      General: He is not in acute distress.    Appearance: Normal appearance. He is well-developed and well-groomed. He is not ill-appearing, toxic-appearing or diaphoretic.  HENT:     Head: Normocephalic and atraumatic.     Jaw: There is normal jaw occlusion.     Right Ear: Hearing normal.     Left Ear: Hearing normal.     Nose: Nose normal.     Mouth/Throat:     Lips: Pink.     Mouth: Mucous membranes are moist.     Pharynx: Oropharynx is clear. Uvula midline.  Eyes:     General: Lids are normal.     Extraocular Movements: Extraocular movements intact.     Conjunctiva/sclera: Conjunctivae normal.     Pupils: Pupils are equal, round, and reactive to light.  Neck:     Musculoskeletal: Normal range of motion and neck supple.     Thyroid: No thyroid mass, thyromegaly or thyroid tenderness.     Vascular: No carotid bruit or JVD.     Trachea: Trachea and phonation normal.  Cardiovascular:     Rate and Rhythm: Normal rate and regular rhythm.     Chest Wall: PMI is not displaced.     Pulses: Normal pulses.     Heart sounds: Normal heart sounds. No murmur. No friction rub. No gallop.   Pulmonary:     Effort: Pulmonary effort is normal. No respiratory distress.     Breath sounds:  Normal breath sounds. No wheezing.  Abdominal:     General: Bowel sounds are normal. There is no distension or abdominal bruit.     Palpations: Abdomen is soft. There is no hepatomegaly or splenomegaly.     Tenderness: There is no abdominal tenderness. There is no right CVA tenderness or left CVA tenderness.     Hernia:  No hernia is present.  Musculoskeletal: Normal range of motion.     Left knee: Normal.     Right lower leg: No edema.     Left lower leg: He exhibits swelling. He exhibits no tenderness, no bony tenderness, no deformity and no laceration. 1+ Pitting Edema present.     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 No ulcerations present. Pulses WNL. Prominent varicose veins to posterior left lower extremity.   Lymphadenopathy:     Cervical: No cervical adenopathy.  Skin:    General: Skin is warm and dry.     Capillary Refill: Capillary refill takes less than 2 seconds.     Coloration: Skin is not cyanotic, jaundiced or pale.     Findings: No rash.  Neurological:     General: No focal deficit present.     Mental Status: He is alert and oriented to person, place, and time.     Cranial Nerves: Cranial nerves are intact.     Sensory: Sensation is intact.     Motor: Motor function is intact.     Coordination: Coordination is intact.     Gait: Gait is intact.     Deep Tendon Reflexes: Reflexes are normal and symmetric.  Psychiatric:        Attention and Perception: Attention and perception normal.        Mood and Affect: Mood and affect normal.        Speech: Speech normal.        Behavior: Behavior normal. Behavior is cooperative.        Thought Content: Thought content normal.        Cognition and Memory: Cognition and memory normal.        Judgment: Judgment normal.     Results for orders placed or performed in visit on 12/28/18  Cologuard  Result Value Ref Range   Cologuard Negative Negative       Pertinent labs & imaging results that were  available during my care of the patient were reviewed by me and considered in my medical decision making.  Assessment & Plan:  Otilio was seen today for cellulitis.  Diagnoses and all orders for this visit:  Cellulitis of left lower extremity Slight erythema remaining. Continued lower extremity swelling. Will trial silvadene cream dressings. Report any new or worsening symptoms.  -     silver sulfADIAZINE (SILVADENE) 1 % cream; Apply 1 application topically daily. -     CBC with Differential/Platelet  Localized swelling of left lower extremity  CMP ordered. Compression socks recommended. Limit sodium intake. Report any new or worsening symptoms. Follow up with PCP in 4 weeks.  -     silver sulfADIAZINE (SILVADENE) 1 % cream; Apply 1 application topically daily. -     CMP14+EGFR  Leukocytosis, unspecified type Last WBC 18, will recheck today.  -     CBC with Differential/Platelet  Venous insufficiency of left lower extremityLipodermatosclerosis present to left lower leg and ankle with swelling and prominent varicose veins to lower extremity are indicative of chronic venous insufficiency. Suggested compression socks on a daily basis. Sodium restriction. Report any new or worsening symptoms. Follow up with PCP in 4 weeks.     Continue all other maintenance medications.  Follow up plan: Return in about 4 weeks (around 04/18/2019), or if symptoms worsen or fail to improve, for PCP leg swelling and pain.  Educational handout given for edema  The above assessment and management plan was discussed  with the patient. The patient verbalized understanding of and has agreed to the management plan. Patient is aware to call the clinic if symptoms persist or worsen. Patient is aware when to return to the clinic for a follow-up visit. Patient educated on when it is appropriate to go to the emergency department.   Monia Pouch, FNP-C [facility] [phone]

## 2019-03-21 NOTE — Patient Instructions (Signed)

## 2019-03-22 LAB — CMP14+EGFR
ALT: 29 IU/L (ref 0–44)
AST: 26 IU/L (ref 0–40)
Albumin/Globulin Ratio: 1.5 (ref 1.2–2.2)
Albumin: 4.1 g/dL (ref 3.8–4.8)
Alkaline Phosphatase: 108 IU/L (ref 39–117)
BUN/Creatinine Ratio: 21 (ref 10–24)
BUN: 25 mg/dL (ref 8–27)
Bilirubin Total: 0.3 mg/dL (ref 0.0–1.2)
CO2: 22 mmol/L (ref 20–29)
Calcium: 9.3 mg/dL (ref 8.6–10.2)
Chloride: 102 mmol/L (ref 96–106)
Creatinine, Ser: 1.21 mg/dL (ref 0.76–1.27)
GFR calc Af Amer: 72 mL/min/{1.73_m2} (ref >59)
GFR calc non Af Amer: 62 mL/min/{1.73_m2} (ref >59)
Globulin, Total: 2.8 g/dL (ref 1.5–4.5)
Glucose: 182 mg/dL — ABNORMAL HIGH (ref 65–99)
Potassium: 4.6 mmol/L (ref 3.5–5.2)
Sodium: 138 mmol/L (ref 134–144)
Total Protein: 6.9 g/dL (ref 6.0–8.5)

## 2019-03-22 LAB — CBC WITH DIFFERENTIAL/PLATELET
Basophils Absolute: 0.1 10*3/uL (ref 0.0–0.2)
Basos: 1 %
EOS (ABSOLUTE): 0.2 10*3/uL (ref 0.0–0.4)
Eos: 2 %
Hematocrit: 41.7 % (ref 37.5–51.0)
Hemoglobin: 14.2 g/dL (ref 13.0–17.7)
Immature Grans (Abs): 0.4 10*3/uL — ABNORMAL HIGH (ref 0.0–0.1)
Immature Granulocytes: 4 %
Lymphocytes Absolute: 3.6 10*3/uL — ABNORMAL HIGH (ref 0.7–3.1)
Lymphs: 29 %
MCH: 28.9 pg (ref 26.6–33.0)
MCHC: 34.1 g/dL (ref 31.5–35.7)
MCV: 85 fL (ref 79–97)
Monocytes Absolute: 0.7 10*3/uL (ref 0.1–0.9)
Monocytes: 5 %
Neutrophils Absolute: 7.4 10*3/uL — ABNORMAL HIGH (ref 1.4–7.0)
Neutrophils: 59 %
Platelets: 256 10*3/uL (ref 150–450)
RBC: 4.91 x10E6/uL (ref 4.14–5.80)
RDW: 12.1 % (ref 11.6–15.4)
WBC: 12.5 10*3/uL — ABNORMAL HIGH (ref 3.4–10.8)

## 2019-04-26 ENCOUNTER — Other Ambulatory Visit: Payer: Self-pay

## 2019-04-27 ENCOUNTER — Other Ambulatory Visit: Payer: Self-pay | Admitting: *Deleted

## 2019-04-27 ENCOUNTER — Other Ambulatory Visit: Payer: Self-pay | Admitting: Family Medicine

## 2019-04-27 ENCOUNTER — Ambulatory Visit (INDEPENDENT_AMBULATORY_CARE_PROVIDER_SITE_OTHER): Payer: Medicare Other | Admitting: Family Medicine

## 2019-04-27 ENCOUNTER — Encounter: Payer: Self-pay | Admitting: Family Medicine

## 2019-04-27 VITALS — BP 145/78 | HR 75 | Temp 98.0°F | Ht 70.0 in | Wt 263.2 lb

## 2019-04-27 DIAGNOSIS — R7303 Prediabetes: Secondary | ICD-10-CM

## 2019-04-27 DIAGNOSIS — Z86718 Personal history of other venous thrombosis and embolism: Secondary | ICD-10-CM

## 2019-04-27 DIAGNOSIS — S39012A Strain of muscle, fascia and tendon of lower back, initial encounter: Secondary | ICD-10-CM

## 2019-04-27 DIAGNOSIS — I10 Essential (primary) hypertension: Secondary | ICD-10-CM

## 2019-04-27 LAB — BAYER DCA HB A1C WAIVED: HB A1C (BAYER DCA - WAIVED): 8 % — ABNORMAL HIGH (ref <7.0)

## 2019-04-27 MED ORDER — METFORMIN HCL 1000 MG PO TABS: 1000.0000 mg | ORAL_TABLET | Freq: Every day | ORAL | 3 refills | Status: DC

## 2019-04-27 MED ORDER — METFORMIN HCL 1000 MG PO TABS: 1000.0000 mg | ORAL_TABLET | Freq: Two times a day (BID) | ORAL | 3 refills | Status: DC

## 2019-04-27 MED ORDER — CYCLOBENZAPRINE HCL 10 MG PO TABS: 10.0000 mg | ORAL_TABLET | Freq: Every day | ORAL | 1 refills | Status: DC

## 2019-04-27 NOTE — Progress Notes (Unsigned)
I sent in the twice a day metformin for the patient.

## 2019-04-27 NOTE — Progress Notes (Signed)
BP (!) 145/78   Pulse 75   Temp 98 F (36.7 C) (Temporal)   Ht 5' 10"  (1.778 m)   Wt 263 lb 3.2 oz (119.4 kg)   BMI 37.77 kg/m    Subjective:   Patient ID: David Tate, male    DOB: 1956-03-24, 63 y.o.   MRN: [id number]  HPI: David Tate is a 63 y.o. male presenting on 04/27/2019 for Cellulitis (left lower leg- re check ) and Medical Management of Chronic Issues   HPI Patient is coming in for recheck of left lower leg cellulitis, the redness and warmth is completely gone now and he says his only issue now is that it itches a lot at night.  He says that it just bothers him with the itching and he has been using some lotions on it but he denies any sores or redness or warmth.  Patient comes in complaining of low back pain that has been going on and off since he was in his 73s and symptoms of a flareup in symptoms of warmth, he says it will flareup more when he is working and he says is more in the right lower back but then he will sometimes get on the left lower back and sometimes get shooting pain down into his left thigh as well.  Patient denies any numbness or weakness but does say that it keeps him up at night sometimes.  Prediabetes Patient comes in today for recheck of his diabetes. Patient has been currently taking metformin. Patient is currently on an ACE inhibitor/ARB. Patient has not seen an ophthalmologist this year. Patient denies any issues with their feet.   Hypertension Patient is currently on losartan, and their blood pressure today is 145/78. Patient denies any lightheadedness or dizziness. Patient denies headaches, blurred vision, chest pains, shortness of breath, or weakness. Denies any side effects from medication and is content with current medication.   Patient has a history of DVT and is on Xarelto.  Relevant past medical, surgical, family and social history reviewed and updated as indicated. Interim medical history since our last visit reviewed. Allergies and  medications reviewed and updated.  Review of Systems  Constitutional: Negative for chills and fever.  Respiratory: Negative for shortness of breath and wheezing.   Cardiovascular: Negative for chest pain and leg swelling.  Musculoskeletal: Positive for back pain. Negative for arthralgias and gait problem.  Skin: Negative for rash.  Neurological: Negative for dizziness and light-headedness.  Psychiatric/Behavioral: Negative for decreased concentration and dysphoric mood.  All other systems reviewed and are negative.   Per HPI unless specifically indicated above   Allergies as of 04/27/2019      Reactions   Lisinopril Cough      Medication List       Accurate as of April 27, 2019  8:57 AM. If you have any questions, ask your nurse or doctor.        losartan 50 MG tablet Commonly known as: COZAAR Take 2 tablets (100 mg total) by mouth daily.   metFORMIN 1000 MG tablet Commonly known as: GLUCOPHAGE Take 1 tablet (1,000 mg total) by mouth daily with breakfast.   pravastatin 20 MG tablet Commonly known as: PRAVACHOL Take 1 tablet (20 mg total) by mouth daily.   Rivaroxaban 15 MG Tabs tablet Commonly known as: XARELTO Take 1 tablet (15 mg total) by mouth daily with supper.   silver sulfADIAZINE 1 % cream Commonly known as: SILVADENE Apply 1 application topically daily.  Objective:   BP (!) 145/78   Pulse 75   Temp 98 F (36.7 C) (Temporal)   Ht 5' 10"  (1.778 m)   Wt 263 lb 3.2 oz (119.4 kg)   BMI 37.77 kg/m   Wt Readings from Last 3 Encounters:  04/27/19 263 lb 3.2 oz (119.4 kg)  03/21/19 264 lb 3.2 oz (119.8 kg)  03/14/19 262 lb 12.8 oz (119.2 kg)    Physical Exam Vitals signs and nursing note reviewed.  Constitutional:      General: He is not in acute distress.    Appearance: He is well-developed. He is not diaphoretic.  Eyes:     General: No scleral icterus.       Right eye: No discharge.     Conjunctiva/sclera: Conjunctivae normal.      Pupils: Pupils are equal, round, and reactive to light.  Neck:     Musculoskeletal: Neck supple.     Thyroid: No thyromegaly.  Cardiovascular:     Rate and Rhythm: Normal rate and regular rhythm.     Heart sounds: Normal heart sounds. No murmur.  Pulmonary:     Effort: Pulmonary effort is normal. No respiratory distress.     Breath sounds: Normal breath sounds. No wheezing.  Musculoskeletal: Normal range of motion.     Lumbar back: He exhibits tenderness. He exhibits normal range of motion, no bony tenderness, no swelling, no deformity and normal pulse.       Back:  Lymphadenopathy:     Cervical: No cervical adenopathy.  Skin:    General: Skin is warm and dry.     Findings: No rash.  Neurological:     Mental Status: He is alert and oriented to person, place, and time.     Coordination: Coordination normal.  Psychiatric:        Behavior: Behavior normal.     Results for orders placed or performed in visit on 03/21/19  CMP14+EGFR  Result Value Ref Range   Glucose 182 (H) 65 - 99 mg/dL   BUN 25 8 - 27 mg/dL   Creatinine, Ser 1.21 0.76 - 1.27 mg/dL   GFR calc non Af Amer 62 >59 mL/min/1.73   GFR calc Af Amer 72 >59 mL/min/1.73   BUN/Creatinine Ratio 21 10 - 24   Sodium 138 134 - 144 mmol/L   Potassium 4.6 3.5 - 5.2 mmol/L   Chloride 102 96 - 106 mmol/L   CO2 22 20 - 29 mmol/L   Calcium 9.3 8.6 - 10.2 mg/dL   Total Protein 6.9 6.0 - 8.5 g/dL   Albumin 4.1 3.8 - 4.8 g/dL   Globulin, Total 2.8 1.5 - 4.5 g/dL   Albumin/Globulin Ratio 1.5 1.2 - 2.2   Bilirubin Total 0.3 0.0 - 1.2 mg/dL   Alkaline Phosphatase 108 39 - 117 IU/L   AST 26 0 - 40 IU/L   ALT 29 0 - 44 IU/L  CBC with Differential/Platelet  Result Value Ref Range   WBC 12.5 (H) 3.4 - 10.8 x10E3/uL   RBC 4.91 4.14 - 5.80 x10E6/uL   Hemoglobin 14.2 13.0 - 17.7 g/dL   Hematocrit 41.7 37.5 - 51.0 %   MCV 85 79 - 97 fL   MCH 28.9 26.6 - 33.0 pg   MCHC 34.1 31.5 - 35.7 g/dL   RDW 12.1 11.6 - 15.4 %   Platelets 256  150 - 450 x10E3/uL   Neutrophils 59 Not Estab. %   Lymphs 29 Not Estab. %   Monocytes 5  Not Estab. %   Eos 2 Not Estab. %   Basos 1 Not Estab. %   Neutrophils Absolute 7.4 (H) 1.4 - 7.0 x10E3/uL   Lymphocytes Absolute 3.6 (H) 0.7 - 3.1 x10E3/uL   Monocytes Absolute 0.7 0.1 - 0.9 x10E3/uL   EOS (ABSOLUTE) 0.2 0.0 - 0.4 x10E3/uL   Basophils Absolute 0.1 0.0 - 0.2 x10E3/uL   Immature Granulocytes 4 Not Estab. %   Immature Grans (Abs) 0.4 (H) 0.0 - 0.1 x10E3/uL    Assessment & Plan:   Problem List Items Addressed This Visit      Cardiovascular and Mediastinum   Hypertension     Other   Prediabetes - Primary   Relevant Orders   Bayer DCA Hb A1c Waived   History of DVT (deep vein thrombosis)    Other Visit Diagnoses    Lumbar strain, initial encounter       Relevant Medications   cyclobenzaprine (FLEXERIL) 10 MG tablet   Other Relevant Orders   Ambulatory referral to Physical Therapy      Will refer to physical therapy and give muscle relaxer for his back and if not improved after that and he will return.  Just had blood work couple weeks ago, will do A1c to recheck. Follow up plan: No follow-ups on file.  Counseling provided for all of the vaccine components No orders of the defined types were placed in this encounter.   Caryl Pina, MD [facility] 04/27/2019, 8:57 AM

## 2019-04-27 NOTE — Progress Notes (Signed)
Patient aware.

## 2019-05-02 ENCOUNTER — Ambulatory Visit: Payer: Medicare Other | Attending: Family Medicine | Admitting: Physical Therapy

## 2019-05-02 ENCOUNTER — Encounter: Payer: Self-pay | Admitting: Physical Therapy

## 2019-05-02 ENCOUNTER — Other Ambulatory Visit: Payer: Self-pay

## 2019-05-02 DIAGNOSIS — M545 Low back pain, unspecified: Secondary | ICD-10-CM

## 2019-05-02 DIAGNOSIS — G8929 Other chronic pain: Secondary | ICD-10-CM | POA: Diagnosis not present

## 2019-05-02 NOTE — Therapy (Addendum)
[facility] [place], [place], [address] Phone: [phone]   Fax:  [phone]  Physical Therapy Evaluation  Patient Details  Name: David Tate MRN: [id number] Date of Birth: 05/13/1956 Referring Provider (PT): Caryl Pina MD   Encounter Date: 05/02/2019  PT End of Session - 05/02/19 1114    Visit Number  1    Number of Visits  12    Date for PT Re-Evaluation  06/13/19    PT Start Time  0945    PT Stop Time  1031    PT Time Calculation (min)  46 min       Past Medical History:  Diagnosis Date  . Diabetes mellitus without complication ([place])   . H/O blood clots     Past Surgical History:  Procedure Laterality Date  . LEG SURGERY Left     There were no vitals filed for this visit.   Subjective Assessment - 05/02/19 1118    Subjective  COVID-19 screen performed prior to patient entering clinic.  The patient presents to the clinic today with a long h/o low back pain.  He worked in [employer] many years and states it took its toll.  His pain-level today is a 6/10 but can rise to 10/10 with increased walking, bending and standing.  He will on occasions experience pain into his left hip and LE.    Pertinent History  DM, H/o DVT    Limitations  Standing    How long can you stand comfortably?  10 minutes.    How long can you walk comfortably?  Short community distances.    Patient Stated Goals  reduce pain and be able to do more around the house.    Currently in Pain?  Yes    Pain Score  6     Pain Location  Back    Pain Orientation  Right;Left    Pain Descriptors / Indicators  Aching    Pain Type  Chronic pain    Pain Onset  More than a month ago    Pain Frequency  Constant    Aggravating Factors   See above.    Pain Relieving Factors  Rest.         [facility] PT Assessment - 05/02/19 0001      Assessment   Medical Diagnosis  Lumbar strain    Referring Provider (PT)  Vonna Kotyk Dettinger MD    Onset  Date/Surgical Date  --   Ongoing.     Precautions   Precautions  None      Restrictions   Weight Bearing Restrictions  No      Balance Screen   Has the patient fallen in the past 6 months  No    Has the patient had a decrease in activity level because of a fear of falling?   No    Is the patient reluctant to leave their home because of a fear of falling?   No      Home Environment   Living Environment  Private residence      Prior Function   Level of Independence  Independent      Posture/Postural Control   Posture/Postural Control  Postural limitations    Postural Limitations  Rounded Shoulders;Forward head;Increased lumbar lordosis      Deep Tendon Reflexes   DTR Assessment Site  --   Diminished LE DTR's.     ROM / Strength   AROM / PROM / Strength  AROM;Strength  AROM   Overall AROM Comments  Normal lumbar intervertebral movement into active flexion and extension to 18 degrees.      Strength   Overall Strength Comments  Normal bilateral LE strength.      Palpation   Palpation comment  Pain reported at L4 to S1 across his low back.      Special Tests   Other special tests  (=) Leg lengths.  (-) SLR testing though his hamstrings are very tight.      Ambulation/Gait   Gait Comments  Slow and purposeful.                Objective measurements completed on examination: See above findings.      [facility] Adult PT Treatment/Exercise - 05/02/19 0001      Modalities   Modalities  Electrical Stimulation;Moist Heat      Moist Heat Therapy   Number Minutes Moist Heat  15 Minutes    Moist Heat Location  Lumbar Spine      Electrical Stimulation   Electrical Stimulation Location  Low back.    Electrical Stimulation Action  IFC    Electrical Stimulation Parameters  80-150 Hz x 15 minutes.    Electrical Stimulation Goals  Pain               PT Short Term Goals - 05/02/19 1417      PT SHORT TERM GOAL #1   Title  STG's=LTG's.        PT Long  Term Goals - 05/02/19 1418      PT LONG TERM GOAL #1   Title  Ind with a HEP.    Time  6    Period  Weeks    Status  New      PT LONG TERM GOAL #2   Title  Stand 30 minutes with pain not > 3-4/10.    Time  6    Period  Weeks    Status  New      PT LONG TERM GOAL #3   Title  Perform ADL's with pain not > 3-4/10.    Time  6    Period  Weeks    Status  New             Plan - 05/02/19 1255    Clinical Impression Statement  The patient presents to [facility] with c/o chronic low back pain that has been ongoing over many years.  He has c/o pain across his lower lumbar region and occasional c/o of pain into his left hip and LE with prolong standing and walking.  He states his pain prohibits him from perform ADL's at home over an extended period of time.  Patient will benefit from skilled physical therapy intervention to address deficits and pain.    Personal Factors and Comorbidities  Comorbidity 1    Comorbidities  DM    Examination-Activity Limitations  Lift;Stand;Locomotion Level    Stability/Clinical Decision Making  Evolving/Moderate complexity    Clinical Decision Making  Low    Rehab Potential  Good    PT Frequency  2x / week    PT Duration  6 weeks    PT Treatment/Interventions  ADLs/Self Care Home Management;Cryotherapy;Electrical Stimulation;Moist Heat;Traction;Ultrasound;Therapeutic exercise;Therapeutic activities;Patient/family education;Manual techniques;Passive range of motion    PT Next Visit Plan  SKTC, supine HSS, hip bridges, standing extension.  HMP/E'stim, STW/M, combo e'stim/U/S.    Consulted and Agree with Plan of Care  Patient  Patient will benefit from skilled therapeutic intervention in order to improve the following deficits and impairments:  Pain, Decreased activity tolerance, Decreased range of motion  Visit Diagnosis: 1. Chronic bilateral low back pain without sciatica        Problem List Patient Active Problem List   Diagnosis Date Noted   . Venous insufficiency of left lower extremity 03/21/2019  . Prediabetes 10/22/2018  . History of DVT (deep vein thrombosis) 10/22/2018  . Morbid obesity ([place]) 10/22/2018  . Hypertension 10/22/2018   PHYSICAL THERAPY DISCHARGE SUMMARY  Visits from Start of Care: 1.  Current functional level related to goals / functional outcomes: See above.   Remaining deficits: See below.   Education / Equipment:   Plan: Patient agrees to discharge.  Patient goals were not met. Patient is being discharged due to not returning since the last visit.  ?????      APPLEGATE, [place] MPT 05/02/2019, 2:20 PM  [facility] [address] [place], [place], [address] Phone: [phone]   Fax:  [phone]  Name: David Tate MRN: [id number] Date of Birth: 09-28-1956

## 2019-05-05 ENCOUNTER — Encounter: Payer: Medicare Other | Admitting: Physical Therapy

## 2019-05-31 DIAGNOSIS — Z23 Encounter for immunization: Secondary | ICD-10-CM | POA: Diagnosis not present

## 2019-07-29 ENCOUNTER — Ambulatory Visit (INDEPENDENT_AMBULATORY_CARE_PROVIDER_SITE_OTHER): Payer: Medicare Other | Admitting: Family Medicine

## 2019-07-29 ENCOUNTER — Encounter: Payer: Self-pay | Admitting: Family Medicine

## 2019-07-29 DIAGNOSIS — Z1159 Encounter for screening for other viral diseases: Secondary | ICD-10-CM

## 2019-07-29 DIAGNOSIS — I10 Essential (primary) hypertension: Secondary | ICD-10-CM | POA: Diagnosis not present

## 2019-07-29 DIAGNOSIS — E785 Hyperlipidemia, unspecified: Secondary | ICD-10-CM | POA: Diagnosis not present

## 2019-07-29 DIAGNOSIS — E1169 Type 2 diabetes mellitus with other specified complication: Secondary | ICD-10-CM | POA: Diagnosis not present

## 2019-07-29 DIAGNOSIS — F410 Panic disorder [episodic paroxysmal anxiety] without agoraphobia: Secondary | ICD-10-CM

## 2019-07-29 DIAGNOSIS — S39012D Strain of muscle, fascia and tendon of lower back, subsequent encounter: Secondary | ICD-10-CM

## 2019-07-29 DIAGNOSIS — Z1211 Encounter for screening for malignant neoplasm of colon: Secondary | ICD-10-CM | POA: Diagnosis not present

## 2019-07-29 DIAGNOSIS — Z86718 Personal history of other venous thrombosis and embolism: Secondary | ICD-10-CM | POA: Diagnosis not present

## 2019-07-29 MED ORDER — SERTRALINE HCL 50 MG PO TABS: 50.0000 mg | ORAL_TABLET | Freq: Every day | ORAL | 1 refills | Status: DC

## 2019-07-29 MED ORDER — RIVAROXABAN 15 MG PO TABS: 15.0000 mg | ORAL_TABLET | Freq: Every day | ORAL | 3 refills | Status: DC

## 2019-07-29 MED ORDER — PRAVASTATIN SODIUM 20 MG PO TABS: 20.0000 mg | ORAL_TABLET | Freq: Every day | ORAL | 3 refills | Status: DC

## 2019-07-29 MED ORDER — METFORMIN HCL ER 500 MG PO TB24: 1000.0000 mg | ORAL_TABLET | Freq: Two times a day (BID) | ORAL | 3 refills | Status: DC

## 2019-07-29 NOTE — Addendum Note (Signed)
Addended by: Caryl Pina on: 07/29/2019 05:06 PM   Modules accepted: Orders, Level of Service

## 2019-07-29 NOTE — Progress Notes (Addendum)
Virtual Visit via telephone Note  I connected with David Tate on 07/29/19 at 1634 by telephone and verified that I am speaking with the correct person using two identifiers. David Tate is currently located at home and no other people are currently with her during visit. The provider, David Kaufmann Kaamil Morefield, MD is located in their office at time of visit.  Call ended at 1704  I discussed the limitations, risks, security and privacy concerns of performing an evaluation and management service by telephone and the availability of in person appointments. I also discussed with the patient that there may be a patient responsible charge related to this service. The patient expressed understanding and agreed to proceed.   History and Present Illness: Type 2 diabetes mellitus Patient comes in today for recheck of his diabetes. Patient has been currently taking metformin 1000 bid. Patient is currently on an ACE inhibitor/ARB. Patient has not seen an ophthalmologist this year. Patient denies any issues with their feet. Patient is having bowel issues increased since increasing metformin.   Hypertension Patient is currently on losartan, and their blood pressure today is unknown. Patient denies any lightheadedness or dizziness. Patient denies headaches, blurred vision, chest pains, shortness of breath, or weakness. Denies any side effects from medication and is content with current medication.   Anxiety, Patient is calling in to discuss anxiety and he is having an increase because of his friend's puppy who they are watching and they have ended up keeping the dog and that is causing him a lot more stress and anger reaction.   He had low back pain and got sick   Outpatient Encounter Medications as of 07/29/2019  Medication Sig  . cyclobenzaprine (FLEXERIL) 10 MG tablet Take 1 tablet (10 mg total) by mouth at bedtime.  Marland Kitchen losartan (COZAAR) 50 MG tablet Take 2 tablets (100 mg total) by mouth daily.  .  metFORMIN (GLUCOPHAGE) 1000 MG tablet Take 1 tablet (1,000 mg total) by mouth 2 (two) times daily with a meal.  . pravastatin (PRAVACHOL) 20 MG tablet Take 1 tablet (20 mg total) by mouth daily.  . Rivaroxaban (XARELTO) 15 MG TABS tablet Take 1 tablet (15 mg total) by mouth daily with supper.  . silver sulfADIAZINE (SILVADENE) 1 % cream Apply 1 application topically daily.   No facility-administered encounter medications on file as of 07/29/2019.     Review of Systems  Constitutional: Negative for chills and fever.  Eyes: Negative for visual disturbance.  Respiratory: Negative for shortness of breath and wheezing.   Cardiovascular: Negative for chest pain and leg swelling.  Musculoskeletal: Positive for back pain. Negative for gait problem.  Skin: Negative for rash.  Neurological: Negative for dizziness, weakness and light-headedness.  All other systems reviewed and are negative.   Observations/Objective: Patient sounds comfortable and in no acute distress  Assessment and Plan: Problem List Items Addressed This Visit      Cardiovascular and Mediastinum   Hypertension   Relevant Medications   pravastatin (PRAVACHOL) 20 MG tablet   Rivaroxaban (XARELTO) 15 MG TABS tablet   Other Relevant Orders   CMP14+EGFR     Endocrine   Type 2 diabetes mellitus with other specified complication (HCC) - Primary   Relevant Medications   metFORMIN (GLUCOPHAGE XR) 500 MG 24 hr tablet   pravastatin (PRAVACHOL) 20 MG tablet   Other Relevant Orders   Bayer DCA Hb A1c Waived   CMP14+EGFR     Other   History of DVT (deep vein thrombosis)  Relevant Medications   Rivaroxaban (XARELTO) 15 MG TABS tablet   Other Relevant Orders   CBC with Differential/Platelet    Other Visit Diagnoses    Hyperlipidemia associated with type 2 diabetes mellitus ([place])       Relevant Medications   metFORMIN (GLUCOPHAGE XR) 500 MG 24 hr tablet   pravastatin (PRAVACHOL) 20 MG tablet   Other Relevant Orders    Lipid panel   Strain of lumbar region, subsequent encounter       Relevant Orders   Ambulatory referral to Physical Therapy   Anxiety attack       Relevant Medications   sertraline (ZOLOFT) 50 MG tablet   Other Relevant Orders   TSH   Need for hepatitis C screening test       Relevant Orders   Hepatitis C antibody   Colon cancer screening          Will try zoloft for anxiety and see if it helps  Switch Metformin to extended release Metformin because has been having some bowel issues and will see if that does better.  Continue pravastatin and Xarelto  Did referral to physical therapy because he could not do it earlier in the year because he got sick and then got canceled. Follow Up Instructions:  Follow up in 3 months for diabetes   I discussed the assessment and treatment plan with the patient. The patient was provided an opportunity to ask questions and all were answered. The patient agreed with the plan and demonstrated an understanding of the instructions.   The patient was advised to call back or seek an in-person evaluation if the symptoms worsen or if the condition fails to improve as anticipated.  The above assessment and management plan was discussed with the patient. The patient verbalized understanding of and has agreed to the management plan. Patient is aware to call the clinic if symptoms persist or worsen. Patient is aware when to return to the clinic for a follow-up visit. Patient educated on when it is appropriate to go to the emergency department.    I provided 30 minutes of non-face-to-face time during this encounter.    Worthy Rancher, MD

## 2019-08-02 DIAGNOSIS — I83812 Varicose veins of left lower extremities with pain: Secondary | ICD-10-CM | POA: Diagnosis not present

## 2019-08-02 DIAGNOSIS — R600 Localized edema: Secondary | ICD-10-CM | POA: Diagnosis not present

## 2019-08-02 DIAGNOSIS — M79672 Pain in left foot: Secondary | ICD-10-CM | POA: Diagnosis not present

## 2019-08-02 DIAGNOSIS — I878 Other specified disorders of veins: Secondary | ICD-10-CM | POA: Diagnosis not present

## 2019-08-02 DIAGNOSIS — R2242 Localized swelling, mass and lump, left lower limb: Secondary | ICD-10-CM | POA: Diagnosis not present

## 2019-08-04 ENCOUNTER — Encounter: Payer: Self-pay | Admitting: Physical Therapy

## 2019-08-04 ENCOUNTER — Other Ambulatory Visit: Payer: Self-pay

## 2019-08-04 ENCOUNTER — Ambulatory Visit: Payer: Medicare Other | Attending: Family Medicine | Admitting: Physical Therapy

## 2019-08-04 DIAGNOSIS — G8929 Other chronic pain: Secondary | ICD-10-CM | POA: Diagnosis not present

## 2019-08-04 DIAGNOSIS — M545 Low back pain, unspecified: Secondary | ICD-10-CM

## 2019-08-04 NOTE — Therapy (Signed)
[facility] [place], [place], [address] Phone: [phone]   Fax:  [phone]  Physical Therapy Treatment  Patient Details  Name: David Tate MRN: [id number] Date of Birth: [birth year] Referring Provider (PT): Caryl Pina MD   Encounter Date: 08/04/2019  PT End of Session - 08/04/19 1357    Number of Visits  12    Date for PT Re-Evaluation  09/15/19    PT Start Time  [id number]    PT Stop Time  1113    PT Time Calculation (min)  38 min    Activity Tolerance  Patient tolerated treatment well       Past Medical History:  Diagnosis Date  . Diabetes mellitus without complication ([place])   . H/O blood clots     Past Surgical History:  Procedure Laterality Date  . LEG SURGERY Left     There were no vitals filed for this visit.  Subjective Assessment - 08/04/19 1347    Subjective  COVID-19 screen performed prior to patient entering clinic.  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  His pain is low today though he states he has done much.  He reports recently he was using has backpack blower and was in a great deal of pain after using it.  He reports left sided low back pain with radiation into his left buttock today.  He reports his pain locations can vary.    Pertinent History  DM, H/o DVT    Limitations  Standing    How long can you stand comfortably?  10 minutes.    Patient Stated Goals  reduce pain and be able to do more around the house.    Pain Score  2     Pain Location  Back    Pain Orientation  Left    Pain Descriptors / Indicators  Aching;Sharp    Pain Type  Chronic pain    Pain Onset  More than a month ago         [facility] PT Assessment - 08/04/19 0001      Assessment   Medical Diagnosis  Lumbar strain    Referring Provider (PT)  Vonna Kotyk Dettinger MD    Onset Date/Surgical Date  --   Ongoing.     Precautions   Precautions  None      Restrictions   Weight Bearing Restrictions  No      Balance Screen   Has the patient fallen in the past 6 months  No    Has the patient had a decrease in activity level because of a fear of falling?   No    Is the patient reluctant to leave their home because of a fear of falling?   No      Home Environment   Living Environment  Private residence      Prior Function   Level of Independence  Independent      Posture/Postural Control   Posture/Postural Control  Postural limitations    Postural Limitations  Rounded Shoulders;Forward head;Decreased lumbar lordosis      Deep Tendon Reflexes   DTR Assessment Site  --   Diminished LE DTR's.     AROM   Overall AROM Comments  Lumbar flexion limited by 25% and extension to 16 degrees.      Strength   Overall Strength Comments  Normal bilateral LE strength.      Palpation   Palpation  comment  increased left lumbar muscle tone and pain in left L4-S1 region.      Special Tests   Other special tests  Increased pain with left Slump test.      Ambulation/Gait   Gait Comments  Slow and purposeful.                   [facility] Adult PT Treatment/Exercise - 08/04/19 0001      Modalities   Modalities  Electrical Stimulation;Moist Heat      Moist Heat Therapy   Number Minutes Moist Heat  16 Minutes    Moist Heat Location  Lumbar Spine      Electrical Stimulation   Electrical Stimulation Location  Left low back.    Electrical Stimulation Action  Pre-mod.    Electrical Stimulation Parameters  80-150 Hz x 16 minutes.    Electrical Stimulation Goals  Pain               PT Short Term Goals - 05/02/19 1417      PT SHORT TERM GOAL #1   Title  STG's=LTG's.        PT Long Term Goals - 08/04/19 1409      PT LONG TERM GOAL #1   Title  Ind with a HEP.    Time  6    Period  Weeks      PT LONG TERM GOAL #2   Title  Stand 30 minutes with pain not > 3-4/10.    Time  6    Period  Weeks    Status  New      PT LONG TERM GOAL #3    Title  Perform ADL's with pain not > 3-4/10.    Time  6    Period  Weeks            Plan - 08/04/19 1358    Clinical Impression Statement  The patient presents to OPPT with chronic low back pain.  His CC today is that of left sided low back pain and pain into his left buttock.  The more he is up and does at home the worse his pain gets.  Patient will benefit from skilled physical therapy intervention to address deficits and pain.    Personal Factors and Comorbidities  Comorbidity 1    Examination-Activity Limitations  Lift;Stand;Locomotion Level    Stability/Clinical Decision Making  Evolving/Moderate complexity    Rehab Potential  Good    PT Frequency  2x / week    PT Duration  6 weeks    PT Treatment/Interventions  ADLs/Self Care Home Management;Cryotherapy;Electrical Stimulation;Moist Heat;Traction;Ultrasound;Therapeutic exercise;Therapeutic activities;Patient/family education;Manual techniques;Passive range of motion    PT Next Visit Plan  SKTC, supine HSS, hip bridges, standing extension.  HMP/E'stim, STW/M, combo e'stim/U/S.    Consulted and Agree with Plan of Care  Patient       Patient will benefit from skilled therapeutic intervention in order to improve the following deficits and impairments:  Pain, Decreased activity tolerance, Decreased range of motion  Visit Diagnosis: Chronic bilateral low back pain without sciatica - Plan: PT plan of care cert/re-cert     Problem List Patient Active Problem List   Diagnosis Date Noted  . Venous insufficiency of left lower extremity 03/21/2019  . Type 2 diabetes mellitus with other specified complication ([place]) [id number]  . History of DVT (deep vein thrombosis) 10/22/2018  . Morbid obesity ([place]) 10/22/2018  . Hypertension 10/22/2018    Jazalynn Mireles, [place] MPT 08/04/2019, 2:21  PM  [facility] [place], [place], [address] Phone: [phone]   Fax:  [phone]  Name:  David Tate MRN: [id number] Date of Birth: 1956-08-28

## 2019-08-11 ENCOUNTER — Ambulatory Visit: Payer: Medicare Other | Admitting: Physical Therapy

## 2019-08-22 ENCOUNTER — Other Ambulatory Visit: Payer: Medicare Other

## 2019-08-22 ENCOUNTER — Other Ambulatory Visit: Payer: Self-pay

## 2019-08-22 DIAGNOSIS — I10 Essential (primary) hypertension: Secondary | ICD-10-CM

## 2019-08-22 DIAGNOSIS — Z86718 Personal history of other venous thrombosis and embolism: Secondary | ICD-10-CM

## 2019-08-22 DIAGNOSIS — E1169 Type 2 diabetes mellitus with other specified complication: Secondary | ICD-10-CM | POA: Diagnosis not present

## 2019-08-22 DIAGNOSIS — E785 Hyperlipidemia, unspecified: Secondary | ICD-10-CM

## 2019-08-22 LAB — BAYER DCA HB A1C WAIVED: HB A1C (BAYER DCA - WAIVED): 8.1 % — ABNORMAL HIGH (ref <7.0)

## 2019-08-23 LAB — CBC WITH DIFFERENTIAL/PLATELET
Basophils Absolute: 0.1 10*3/uL (ref 0.0–0.2)
Basos: 1 %
EOS (ABSOLUTE): 0.3 10*3/uL (ref 0.0–0.4)
Eos: 2 %
Hematocrit: 44.1 % (ref 37.5–51.0)
Hemoglobin: 14.8 g/dL (ref 13.0–17.7)
Immature Grans (Abs): 0.2 10*3/uL — ABNORMAL HIGH (ref 0.0–0.1)
Immature Granulocytes: 2 %
Lymphocytes Absolute: 3.7 10*3/uL — ABNORMAL HIGH (ref 0.7–3.1)
Lymphs: 30 %
MCH: 28.9 pg (ref 26.6–33.0)
MCHC: 33.6 g/dL (ref 31.5–35.7)
MCV: 86 fL (ref 79–97)
Monocytes Absolute: 0.8 10*3/uL (ref 0.1–0.9)
Monocytes: 7 %
Neutrophils Absolute: 7 10*3/uL (ref 1.4–7.0)
Neutrophils: 58 %
Platelets: 220 10*3/uL (ref 150–450)
RBC: 5.12 x10E6/uL (ref 4.14–5.80)
RDW: 12.6 % (ref 11.6–15.4)
WBC: 12.1 10*3/uL — ABNORMAL HIGH (ref 3.4–10.8)

## 2019-08-23 LAB — CMP14+EGFR
ALT: 39 IU/L (ref 0–44)
AST: 29 IU/L (ref 0–40)
Albumin/Globulin Ratio: 1.7 (ref 1.2–2.2)
Albumin: 4.2 g/dL (ref 3.8–4.8)
Alkaline Phosphatase: 112 IU/L (ref 39–117)
BUN/Creatinine Ratio: 16 (ref 10–24)
BUN: 19 mg/dL (ref 8–27)
Bilirubin Total: 0.5 mg/dL (ref 0.0–1.2)
CO2: 20 mmol/L (ref 20–29)
Calcium: 9.1 mg/dL (ref 8.6–10.2)
Chloride: 101 mmol/L (ref 96–106)
Creatinine, Ser: 1.16 mg/dL (ref 0.76–1.27)
GFR calc Af Amer: 76 mL/min/{1.73_m2} (ref >59)
GFR calc non Af Amer: 66 mL/min/{1.73_m2} (ref >59)
Globulin, Total: 2.5 g/dL (ref 1.5–4.5)
Glucose: 180 mg/dL — ABNORMAL HIGH (ref 65–99)
Potassium: 4.5 mmol/L (ref 3.5–5.2)
Sodium: 137 mmol/L (ref 134–144)
Total Protein: 6.7 g/dL (ref 6.0–8.5)

## 2019-08-23 LAB — LIPID PANEL
Chol/HDL Ratio: 5.3 ratio — ABNORMAL HIGH (ref 0.0–5.0)
Cholesterol, Total: 180 mg/dL (ref 100–199)
HDL: 34 mg/dL — ABNORMAL LOW (ref >39)
LDL Chol Calc (NIH): 112 mg/dL — ABNORMAL HIGH (ref 0–99)
Triglycerides: 192 mg/dL — ABNORMAL HIGH (ref 0–149)
VLDL Cholesterol Cal: 34 mg/dL (ref 5–40)

## 2019-08-24 ENCOUNTER — Other Ambulatory Visit: Payer: Self-pay | Admitting: Family Medicine

## 2019-08-24 DIAGNOSIS — F410 Panic disorder [episodic paroxysmal anxiety] without agoraphobia: Secondary | ICD-10-CM

## 2019-08-25 ENCOUNTER — Ambulatory Visit (INDEPENDENT_AMBULATORY_CARE_PROVIDER_SITE_OTHER): Payer: Medicare Other | Admitting: Family Medicine

## 2019-08-25 ENCOUNTER — Encounter: Payer: Self-pay | Admitting: Family Medicine

## 2019-08-25 DIAGNOSIS — I10 Essential (primary) hypertension: Secondary | ICD-10-CM | POA: Diagnosis not present

## 2019-08-25 DIAGNOSIS — E1169 Type 2 diabetes mellitus with other specified complication: Secondary | ICD-10-CM | POA: Diagnosis not present

## 2019-08-25 DIAGNOSIS — Z86718 Personal history of other venous thrombosis and embolism: Secondary | ICD-10-CM

## 2019-08-25 DIAGNOSIS — F410 Panic disorder [episodic paroxysmal anxiety] without agoraphobia: Secondary | ICD-10-CM | POA: Diagnosis not present

## 2019-08-25 MED ORDER — JARDIANCE 10 MG PO TABS: 10.0000 mg | ORAL_TABLET | Freq: Every day | ORAL | 3 refills | Status: DC

## 2019-08-25 MED ORDER — SERTRALINE HCL 50 MG PO TABS: 50.0000 mg | ORAL_TABLET | Freq: Every day | ORAL | 3 refills | Status: DC

## 2019-08-25 MED ORDER — METFORMIN HCL ER 500 MG PO TB24: 1000.0000 mg | ORAL_TABLET | Freq: Every day | ORAL | 3 refills | Status: DC

## 2019-08-25 NOTE — Progress Notes (Signed)
Virtual Visit via telephone Note  I connected with David Tate on 08/25/19 at 1356 by telephone and verified that I am speaking with the correct person using two identifiers. David Tate is currently located at home and no other people are currently with her during visit. The provider, David Kaufmann Dettinger, MD is located in their office at time of visit.  Call ended at 1416  I discussed the limitations, risks, security and privacy concerns of performing an evaluation and management service by telephone and the availability of in person appointments. I also discussed with the patient that there may be a patient responsible charge related to this service. The patient expressed understanding and agreed to proceed.   History and Present Illness: Type 2 diabetes mellitus Patient comes in today for recheck of his diabetes. Patient has been currently taking metformin and is having bowel issues and is taking 2 am metformin. Patient is currently on an ACE inhibitor/ARB. Patient has not seen an ophthalmologist this year. Patient denies any issues with their feet.   Hyperlipidemia Patient is coming in for recheck of his hyperlipidemia. The patient is currently taking pravastatin. They deny any issues with David Tate or history of liver damage from it. They deny any focal numbness or weakness or chest pain.   Hypertension Patient is currently on losartan, and their blood pressure today is unknown. Patient denies any lightheadedness or dizziness. Patient denies headaches, blurred vision, chest pains, shortness of breath, or weakness. Denies any side effects from medication and is content with current medication.   Anxiety He has been using zoloft for anxiety and panic attacks and he feels like it is doing well. Patient denies any suicidal ideations or thoughts of hurting self.   No diagnosis found.  Outpatient Encounter Medications as of 08/25/2019  Medication Sig  . losartan (COZAAR) 50 MG tablet Take  2 tablets (100 mg total) by mouth daily.  . metFORMIN (GLUCOPHAGE XR) 500 MG 24 hr tablet Take 2 tablets (1,000 mg total) by mouth 2 (two) times daily.  . pravastatin (PRAVACHOL) 20 MG tablet Take 1 tablet (20 mg total) by mouth daily.  . Rivaroxaban (XARELTO) 15 MG TABS tablet Take 1 tablet (15 mg total) by mouth daily with supper.  . sertraline (ZOLOFT) 50 MG tablet TAKE 1 TABLET BY MOUTH EVERY DAY  . silver sulfADIAZINE (SILVADENE) 1 % cream Apply 1 application topically daily.   No facility-administered encounter medications on file as of 08/25/2019.     Review of Systems  Constitutional: Negative for chills and fever.  Respiratory: Negative for shortness of breath and wheezing.   Cardiovascular: Negative for chest pain and leg swelling.  Musculoskeletal: Negative for back pain and gait problem.  Skin: Negative for rash.  Neurological: Negative for dizziness, weakness and light-headedness.  All other systems reviewed and are negative.   Observations/Objective: Patient sounds comfortable and in no acute distress  Assessment and Plan: Problem List Items Addressed This Visit      Cardiovascular and Mediastinum   Hypertension     Endocrine   Type 2 diabetes mellitus with other specified complication ([place]) - Primary   Relevant Medications   metFORMIN (GLUCOPHAGE XR) 500 MG 24 hr tablet   empagliflozin (JARDIANCE) 10 MG TABS tablet     Other   History of DVT (deep vein thrombosis)    Other Visit Diagnoses    Anxiety attack       Relevant Medications   sertraline (ZOLOFT) 50 MG tablet  Follow Up Instructions: Follow up in 3 months for diabetes.     I discussed the assessment and treatment plan with the patient. The patient was provided an opportunity to ask questions and all were answered. The patient agreed with the plan and demonstrated an understanding of the instructions.   The patient was advised to call back or seek an in-person evaluation if the symptoms  worsen or if the condition fails to improve as anticipated.  The above assessment and management plan was discussed with the patient. The patient verbalized understanding of and has agreed to the management plan. Patient is aware to call the clinic if symptoms persist or worsen. Patient is aware when to return to the clinic for a follow-up visit. Patient educated on when it is appropriate to go to the emergency department.    I provided 20 minutes of non-face-to-face time during this encounter.    David Rancher, MD

## 2019-09-19 ENCOUNTER — Other Ambulatory Visit: Payer: Self-pay | Admitting: Family Medicine

## 2019-09-19 DIAGNOSIS — S39012A Strain of muscle, fascia and tendon of lower back, initial encounter: Secondary | ICD-10-CM

## 2019-11-16 ENCOUNTER — Other Ambulatory Visit: Payer: Self-pay | Admitting: Family Medicine

## 2019-11-16 DIAGNOSIS — I10 Essential (primary) hypertension: Secondary | ICD-10-CM

## 2019-11-22 ENCOUNTER — Other Ambulatory Visit: Payer: Self-pay | Admitting: Family Medicine

## 2019-11-22 DIAGNOSIS — S39012A Strain of muscle, fascia and tendon of lower back, initial encounter: Secondary | ICD-10-CM

## 2019-11-23 ENCOUNTER — Telehealth: Payer: Self-pay | Admitting: *Deleted

## 2019-11-23 NOTE — Telephone Encounter (Signed)
Prior Auth for Cyclobenzaprine 10mg -In Process  Key: [place] -   PA Case ID: [id number]    If [employer] has not replied to your request within 24 hours please contact [employer] at [phone]. For [facility] members, please call [phone].

## 2019-11-25 NOTE — Telephone Encounter (Signed)
Prior Auth for Cyclobenzaprine 10mg -APPROVED till 11/23/20  Pharmacy notified

## 2019-12-13 ENCOUNTER — Ambulatory Visit: Payer: Medicare Other

## 2019-12-14 ENCOUNTER — Other Ambulatory Visit: Payer: Self-pay

## 2019-12-15 ENCOUNTER — Ambulatory Visit (INDEPENDENT_AMBULATORY_CARE_PROVIDER_SITE_OTHER): Payer: Medicare Other | Admitting: Family Medicine

## 2019-12-15 ENCOUNTER — Encounter: Payer: Self-pay | Admitting: Family Medicine

## 2019-12-15 VITALS — BP 118/68 | HR 63 | Temp 98.9°F | Ht 70.0 in | Wt 255.0 lb

## 2019-12-15 DIAGNOSIS — E785 Hyperlipidemia, unspecified: Secondary | ICD-10-CM | POA: Diagnosis not present

## 2019-12-15 DIAGNOSIS — E1169 Type 2 diabetes mellitus with other specified complication: Secondary | ICD-10-CM | POA: Diagnosis not present

## 2019-12-15 DIAGNOSIS — I10 Essential (primary) hypertension: Secondary | ICD-10-CM | POA: Diagnosis not present

## 2019-12-15 DIAGNOSIS — Z86718 Personal history of other venous thrombosis and embolism: Secondary | ICD-10-CM

## 2019-12-15 LAB — CBC WITH DIFFERENTIAL/PLATELET
Basophils Absolute: 0.2 10*3/uL (ref 0.0–0.2)
Basos: 1 %
EOS (ABSOLUTE): 0.3 10*3/uL (ref 0.0–0.4)
Eos: 2 %
Hematocrit: 47.5 % (ref 37.5–51.0)
Hemoglobin: 15.8 g/dL (ref 13.0–17.7)
Immature Grans (Abs): 0.1 10*3/uL (ref 0.0–0.1)
Immature Granulocytes: 1 %
Lymphocytes Absolute: 3.4 10*3/uL — ABNORMAL HIGH (ref 0.7–3.1)
Lymphs: 31 %
MCH: 28.4 pg (ref 26.6–33.0)
MCHC: 33.3 g/dL (ref 31.5–35.7)
MCV: 85 fL (ref 79–97)
Monocytes Absolute: 0.7 10*3/uL (ref 0.1–0.9)
Monocytes: 7 %
Neutrophils Absolute: 6.2 10*3/uL (ref 1.4–7.0)
Neutrophils: 58 %
Platelets: 216 10*3/uL (ref 150–450)
RBC: 5.57 x10E6/uL (ref 4.14–5.80)
RDW: 12.2 % (ref 11.6–15.4)
WBC: 10.8 10*3/uL (ref 3.4–10.8)

## 2019-12-15 LAB — CMP14+EGFR
ALT: 23 IU/L (ref 0–44)
AST: 24 IU/L (ref 0–40)
Albumin/Globulin Ratio: 1.6 (ref 1.2–2.2)
Albumin: 4.3 g/dL (ref 3.8–4.8)
Alkaline Phosphatase: 106 IU/L (ref 39–117)
BUN/Creatinine Ratio: 16 (ref 10–24)
BUN: 20 mg/dL (ref 8–27)
Bilirubin Total: 0.4 mg/dL (ref 0.0–1.2)
CO2: 22 mmol/L (ref 20–29)
Calcium: 9.7 mg/dL (ref 8.6–10.2)
Chloride: 100 mmol/L (ref 96–106)
Creatinine, Ser: 1.25 mg/dL (ref 0.76–1.27)
GFR calc Af Amer: 69 mL/min/{1.73_m2} (ref >59)
GFR calc non Af Amer: 60 mL/min/{1.73_m2} (ref >59)
Globulin, Total: 2.7 g/dL (ref 1.5–4.5)
Glucose: 181 mg/dL — ABNORMAL HIGH (ref 65–99)
Potassium: 4.6 mmol/L (ref 3.5–5.2)
Sodium: 139 mmol/L (ref 134–144)
Total Protein: 7 g/dL (ref 6.0–8.5)

## 2019-12-15 LAB — LIPID PANEL
Chol/HDL Ratio: 4.6 ratio (ref 0.0–5.0)
Cholesterol, Total: 155 mg/dL (ref 100–199)
HDL: 34 mg/dL — ABNORMAL LOW (ref >39)
LDL Chol Calc (NIH): 93 mg/dL (ref 0–99)
Triglycerides: 161 mg/dL — ABNORMAL HIGH (ref 0–149)
VLDL Cholesterol Cal: 28 mg/dL (ref 5–40)

## 2019-12-15 LAB — BAYER DCA HB A1C WAIVED: HB A1C (BAYER DCA - WAIVED): 7.3 % — ABNORMAL HIGH (ref <7.0)

## 2019-12-15 NOTE — Progress Notes (Signed)
BP 118/68   Pulse 63   Temp 98.9 F (37.2 C)   Ht 5' 10" (1.778 m)   Wt 255 lb (115.7 kg)   SpO2 92%   BMI 36.59 kg/m    Subjective:   Patient ID: David Tate, male    DOB: Jan 18, 1956, 64 y.o.   MRN: [id number]  HPI: David Tate is a 64 y.o. male presenting on 12/15/2019 for Medical Management of Chronic Issues and Diabetes   HPI Type 2 diabetes mellitus Patient comes in today for recheck of his diabetes. Patient has been currently taking Jardiance and Metformin. Patient is currently on an ACE inhibitor/ARB. Patient has not seen an ophthalmologist this year. Patient denies any issues with their feet.   Hypertension Patient is currently on losartan, and their blood pressure today is 118/68. Patient denies any lightheadedness or dizziness. Patient denies headaches, blurred vision, chest pains, shortness of breath, or weakness. Denies any side effects from medication and is content with current medication.  Hyperlipidemia Patient is coming in for recheck of his hyperlipidemia. The patient is currently taking pravastatin. They deny any issues with myalgias or history of liver damage from it. They deny any focal numbness or weakness or chest pain.    Patient's hypertension and diabetes are more complicated by the patient's morbid obesity.  Discussed weight loss and lifestyle modification and exercise with the patient.   Relevant past medical, surgical, family and social history reviewed and updated as indicated. Interim medical history since our last visit reviewed. Allergies and medications reviewed and updated.  Review of Systems  Constitutional: Negative for chills and fever.  Eyes: Negative for visual disturbance.  Respiratory: Negative for shortness of breath and wheezing.   Cardiovascular: Negative for chest pain and leg swelling.  Musculoskeletal: Negative for back pain and gait problem.  Skin: Negative for rash.  Neurological: Negative for dizziness, weakness and numbness.    All other systems reviewed and are negative.   Per HPI unless specifically indicated above   Allergies as of 12/15/2019      Reactions   Lisinopril Cough      Medication List       Accurate as of December 15, 2019 10:47 AM. If you have any questions, ask your nurse or doctor.        STOP taking these medications   cyclobenzaprine 10 MG tablet Commonly known as: FLEXERIL Stopped by: Fransisca Kaufmann Carvin Almas, MD     TAKE these medications   Jardiance 10 MG Tabs tablet Generic drug: empagliflozin Take 10 mg by mouth daily before breakfast.   losartan 50 MG tablet Commonly known as: COZAAR TAKE 1 TABLET BY MOUTH EVERY DAY   metFORMIN 500 MG 24 hr tablet Commonly known as: Glucophage XR Take 2 tablets (1,000 mg total) by mouth daily with breakfast.   pravastatin 20 MG tablet Commonly known as: PRAVACHOL Take 1 tablet (20 mg total) by mouth daily.   Rivaroxaban 15 MG Tabs tablet Commonly known as: XARELTO Take 1 tablet (15 mg total) by mouth daily with supper.   sertraline 50 MG tablet Commonly known as: ZOLOFT Take 1 tablet (50 mg total) by mouth daily.        Objective:   BP 118/68   Pulse 63   Temp 98.9 F (37.2 C)   Ht 5' 10" (1.778 m)   Wt 255 lb (115.7 kg)   SpO2 92%   BMI 36.59 kg/m   Wt Readings from Last 3 Encounters:  12/15/19 255 lb (  115.7 kg)  04/27/19 263 lb 3.2 oz (119.4 kg)  03/21/19 264 lb 3.2 oz (119.8 kg)    Physical Exam Vitals and nursing note reviewed.  Constitutional:      General: He is not in acute distress.    Appearance: He is well-developed. He is not diaphoretic.  Eyes:     General: No scleral icterus.    Conjunctiva/sclera: Conjunctivae normal.  Neck:     Thyroid: No thyromegaly.  Cardiovascular:     Rate and Rhythm: Normal rate and regular rhythm.     Heart sounds: Normal heart sounds. No murmur.  Pulmonary:     Effort: Pulmonary effort is normal. No respiratory distress.     Breath sounds: Normal breath sounds. No  wheezing.  Musculoskeletal:        General: Normal range of motion.     Cervical back: Neck supple.  Lymphadenopathy:     Cervical: No cervical adenopathy.  Skin:    General: Skin is warm and dry.     Findings: No rash.  Neurological:     Mental Status: He is alert and oriented to person, place, and time.     Coordination: Coordination normal.  Psychiatric:        Behavior: Behavior normal.       Assessment & Plan:   Problem List Items Addressed This Visit      Cardiovascular and Mediastinum   Hypertension   Relevant Orders   CMP14+EGFR     Endocrine   Type 2 diabetes mellitus with other specified complication ([place]) - Primary   Relevant Orders   Bayer DCA Hb A1c Waived   CBC with Differential/Platelet   CMP14+EGFR   Lipid panel   Hyperlipidemia associated with type 2 diabetes mellitus ([place])     Other   History of DVT (deep vein thrombosis)   Relevant Orders   CBC with Differential/Platelet   Morbid obesity ([place])   Relevant Orders   CBC with Differential/Platelet      Continue current medication, A1c is up but he will focus on lifestyle modification and exercise and diet changes and if so at next time we will start medication increases for it.,  At 7.3 today. Follow up plan: Return in about 3 months (around 03/16/2020), or if symptoms worsen or fail to improve, for Diabetes and hypertension.  Counseling provided for all of the vaccine components Orders Placed This Encounter  Procedures  . Bayer DCA Hb A1c Waived  . CBC with Differential/Platelet  . CMP14+EGFR  . Lipid panel    Caryl Pina, MD [facility] 12/15/2019, 10:47 AM

## 2019-12-21 ENCOUNTER — Telehealth: Payer: Self-pay | Admitting: Family Medicine

## 2019-12-21 NOTE — Telephone Encounter (Signed)
Patient aware of results.

## 2019-12-30 ENCOUNTER — Ambulatory Visit: Payer: Medicare Other | Attending: Internal Medicine

## 2019-12-30 ENCOUNTER — Ambulatory Visit: Payer: Medicare Other

## 2019-12-30 DIAGNOSIS — Z23 Encounter for immunization: Secondary | ICD-10-CM

## 2019-12-30 NOTE — Progress Notes (Signed)
   Covid-19 Vaccination Clinic  Name:  David Tate    MRN: [id number] DOB: Sep 12, 1956  12/30/2019  [name] was observed post Covid-19 immunization for 15 minutes without incident. He was provided with Vaccine Information Sheet and instruction to access the V-Safe system.   [name] was instructed to call 911 with any severe reactions post vaccine: Marland Kitchen Difficulty breathing  . Swelling of face and throat  . A fast heartbeat  . A bad rash all over body  . Dizziness and weakness   Immunizations Administered    Name Date Dose VIS Date Route   Moderna COVID-19 Vaccine 12/30/2019 10:41 AM 0.5 mL 09/06/2019 Intramuscular   Manufacturer: Moderna   Lot: [id number]   [id number]

## 2020-01-31 ENCOUNTER — Ambulatory Visit: Payer: Medicare Other | Attending: Internal Medicine

## 2020-01-31 DIAGNOSIS — Z23 Encounter for immunization: Secondary | ICD-10-CM

## 2020-01-31 NOTE — Progress Notes (Signed)
   Covid-19 Vaccination Clinic  Name:  David Tate    MRN: [id number] DOB: [birth year]  01/31/2020  [name] was observed post Covid-19 immunization for 15 minutes without incident. He was provided with Vaccine Information Sheet and instruction to access the V-Safe system.   [name] was instructed to call 911 with any severe reactions post vaccine: Marland Kitchen Difficulty breathing  . Swelling of face and throat  . A fast heartbeat  . A bad rash all over body  . Dizziness and weakness   Immunizations Administered    Name Date Dose VIS Date Route   Moderna COVID-19 Vaccine 01/31/2020 12:29 PM 0.5 mL 09/2019 Intramuscular   Manufacturer: Moderna   Lot: [id number]   [id number]

## 2020-02-08 ENCOUNTER — Other Ambulatory Visit: Payer: Self-pay | Admitting: Family Medicine

## 2020-02-08 DIAGNOSIS — I10 Essential (primary) hypertension: Secondary | ICD-10-CM

## 2020-03-13 ENCOUNTER — Other Ambulatory Visit: Payer: Self-pay | Admitting: Family Medicine

## 2020-03-13 DIAGNOSIS — I10 Essential (primary) hypertension: Secondary | ICD-10-CM

## 2020-03-21 ENCOUNTER — Other Ambulatory Visit: Payer: Self-pay

## 2020-03-21 ENCOUNTER — Ambulatory Visit (INDEPENDENT_AMBULATORY_CARE_PROVIDER_SITE_OTHER): Payer: Medicare Other | Admitting: Family Medicine

## 2020-03-21 ENCOUNTER — Encounter: Payer: Self-pay | Admitting: Family Medicine

## 2020-03-21 VITALS — BP 127/83 | HR 55 | Temp 98.7°F | Ht 70.0 in | Wt 235.0 lb

## 2020-03-21 DIAGNOSIS — Z86718 Personal history of other venous thrombosis and embolism: Secondary | ICD-10-CM

## 2020-03-21 DIAGNOSIS — E785 Hyperlipidemia, unspecified: Secondary | ICD-10-CM

## 2020-03-21 DIAGNOSIS — I10 Essential (primary) hypertension: Secondary | ICD-10-CM

## 2020-03-21 DIAGNOSIS — N529 Male erectile dysfunction, unspecified: Secondary | ICD-10-CM

## 2020-03-21 DIAGNOSIS — E1169 Type 2 diabetes mellitus with other specified complication: Secondary | ICD-10-CM | POA: Diagnosis not present

## 2020-03-21 LAB — BAYER DCA HB A1C WAIVED: HB A1C (BAYER DCA - WAIVED): 6.7 % (ref <7.0)

## 2020-03-21 MED ORDER — SILDENAFIL CITRATE 20 MG PO TABS: 20.0000 mg | ORAL_TABLET | ORAL | 1 refills | Status: DC | PRN

## 2020-03-21 NOTE — Progress Notes (Signed)
BP 127/83   Pulse (!) 55   Temp 98.7 F (37.1 C)   Ht 5' 10"  (1.778 m)   Wt 235 lb (106.6 kg)   SpO2 94%   BMI 33.72 kg/m    Subjective:   Patient ID: David Tate, male    DOB: 30-Jun-1956, 64 y.o.   MRN: [id number]  HPI: David Tate is a 64 y.o. male presenting on 03/21/2020 for Medical Management of Chronic Issues and Diabetes   HPI Type 2 diabetes mellitus Patient comes in today for recheck of his diabetes. Patient has been currently taking Metformin and Jardiance, A1c 6.7. Patient is currently on an ACE inhibitor/ARB. Patient has not seen an ophthalmologist this year. Patient denies any issues with their feet. The symptom started onset as an adult hypertension and cholesterol and erectile dysfunction ARE RELATED TO DM   Hypertension Patient is currently on losartan, and their blood pressure today is 127/83. Patient denies any lightheadedness or dizziness. Patient denies headaches, blurred vision, chest pains, shortness of breath, or weakness. Denies any side effects from medication and is content with current medication.   Hyperlipidemia Patient is coming in for recheck of his hyperlipidemia. The patient is currently taking pravastatin. They deny any issues with myalgias or history of liver damage from it. They deny any focal numbness or weakness or chest pain.   History of DVTs Patient is on anticoagulation for history of DVTs, is expected to do long-term anticoagulation.  He has had multiple DVTs  Patient says sometimes having erectile dysfunction getting sustaining an erection and has been more of an issue recently they would like to try something to help with that.  Relevant past medical, surgical, family and social history reviewed and updated as indicated. Interim medical history since our last visit reviewed. Allergies and medications reviewed and updated.  Review of Systems  Constitutional: Negative for chills and fever.  Eyes: Negative for visual disturbance.    Respiratory: Negative for shortness of breath and wheezing.   Cardiovascular: Negative for chest pain and leg swelling.  Musculoskeletal: Negative for back pain and gait problem.  Skin: Negative for rash.  Neurological: Negative for dizziness, weakness, light-headedness and numbness.  All other systems reviewed and are negative.   Per HPI unless specifically indicated above   Allergies as of 03/21/2020      Reactions   Lisinopril Cough      Medication List       Accurate as of March 21, 2020 11:10 AM. If you have any questions, ask your nurse or doctor.        Jardiance 10 MG Tabs tablet Generic drug: empagliflozin Take 10 mg by mouth daily before breakfast.   losartan 50 MG tablet Commonly known as: COZAAR TAKE 1 TABLET BY MOUTH EVERY DAY   metFORMIN 500 MG 24 hr tablet Commonly known as: Glucophage XR Take 2 tablets (1,000 mg total) by mouth daily with breakfast.   pravastatin 20 MG tablet Commonly known as: PRAVACHOL Take 1 tablet (20 mg total) by mouth daily.   Rivaroxaban 15 MG Tabs tablet Commonly known as: XARELTO Take 1 tablet (15 mg total) by mouth daily with supper.   sertraline 50 MG tablet Commonly known as: ZOLOFT Take 1 tablet (50 mg total) by mouth daily.   sildenafil 20 MG tablet Commonly known as: REVATIO Take 1-3 tablets (20-60 mg total) by mouth as needed. Started by: Worthy Rancher, MD        Objective:   BP 127/83  Pulse (!) 55   Temp 98.7 F (37.1 C)   Ht 5' 10"  (1.778 m)   Wt 235 lb (106.6 kg)   SpO2 94%   BMI 33.72 kg/m   Wt Readings from Last 3 Encounters:  03/21/20 235 lb (106.6 kg)  12/15/19 255 lb (115.7 kg)  04/27/19 263 lb 3.2 oz (119.4 kg)    Physical Exam Vitals and nursing note reviewed.  Constitutional:      General: He is not in acute distress.    Appearance: He is well-developed. He is not diaphoretic.  Eyes:     General: No scleral icterus.    Conjunctiva/sclera: Conjunctivae normal.  Neck:      Thyroid: No thyromegaly.  Cardiovascular:     Rate and Rhythm: Normal rate and regular rhythm.     Heart sounds: Normal heart sounds. No murmur heard.   Pulmonary:     Effort: Pulmonary effort is normal. No respiratory distress.     Breath sounds: Normal breath sounds. No wheezing.  Musculoskeletal:        General: Normal range of motion.     Cervical back: Neck supple.  Lymphadenopathy:     Cervical: No cervical adenopathy.  Skin:    General: Skin is warm and dry.     Findings: No rash.  Neurological:     Mental Status: He is alert and oriented to person, place, and time.     Coordination: Coordination normal.  Psychiatric:        Behavior: Behavior normal.       Assessment & Plan:   Problem List Items Addressed This Visit      Cardiovascular and Mediastinum   Hypertension   Relevant Medications   sildenafil (REVATIO) 20 MG tablet   Other Relevant Orders   BMP8+EGFR     Endocrine   Type 2 diabetes mellitus with other specified complication ([place]) - Primary   Relevant Orders   Bayer DCA Hb A1c Waived   BMP8+EGFR   Hyperlipidemia associated with type 2 diabetes mellitus ([facility])     Other   History of DVT (deep vein thrombosis)    Other Visit Diagnoses    Erectile dysfunction, unspecified erectile dysfunction type       Relevant Medications   sildenafil (REVATIO) 20 MG tablet      A1c 6.7, great improvement, he is working on weight loss and following diet, continue with this. We will do a prescription for sildenafil to help with erectile dysfunction Follow up plan: Return in about 3 months (around 06/21/2020), or if symptoms worsen or fail to improve, for Diabetes and hypertension.  Counseling provided for all of the vaccine components Orders Placed This Encounter  Procedures  . Bayer DCA Hb A1c Waived  . BMP8+EGFR    Caryl Pina, MD [facility] 03/21/2020, 11:10 AM

## 2020-03-22 LAB — BMP8+EGFR
BUN/Creatinine Ratio: 13 (ref 10–24)
BUN: 16 mg/dL (ref 8–27)
CO2: 24 mmol/L (ref 20–29)
Calcium: 9.4 mg/dL (ref 8.6–10.2)
Chloride: 102 mmol/L (ref 96–106)
Creatinine, Ser: 1.2 mg/dL (ref 0.76–1.27)
GFR calc Af Amer: 72 mL/min/{1.73_m2} (ref >59)
GFR calc non Af Amer: 63 mL/min/{1.73_m2} (ref >59)
Glucose: 136 mg/dL — ABNORMAL HIGH (ref 65–99)
Potassium: 4.6 mmol/L (ref 3.5–5.2)
Sodium: 139 mmol/L (ref 134–144)

## 2020-06-04 ENCOUNTER — Other Ambulatory Visit: Payer: Self-pay | Admitting: Family Medicine

## 2020-06-04 DIAGNOSIS — E1169 Type 2 diabetes mellitus with other specified complication: Secondary | ICD-10-CM

## 2020-06-14 ENCOUNTER — Other Ambulatory Visit: Payer: Self-pay | Admitting: Family Medicine

## 2020-06-14 DIAGNOSIS — E1169 Type 2 diabetes mellitus with other specified complication: Secondary | ICD-10-CM

## 2020-06-25 ENCOUNTER — Other Ambulatory Visit: Payer: Self-pay

## 2020-06-25 ENCOUNTER — Ambulatory Visit (INDEPENDENT_AMBULATORY_CARE_PROVIDER_SITE_OTHER): Payer: Medicare Other | Admitting: Family Medicine

## 2020-06-25 ENCOUNTER — Encounter: Payer: Self-pay | Admitting: Family Medicine

## 2020-06-25 VITALS — BP 109/64 | HR 57 | Temp 98.2°F | Ht 70.0 in | Wt 237.0 lb

## 2020-06-25 DIAGNOSIS — Z86718 Personal history of other venous thrombosis and embolism: Secondary | ICD-10-CM

## 2020-06-25 DIAGNOSIS — N529 Male erectile dysfunction, unspecified: Secondary | ICD-10-CM

## 2020-06-25 DIAGNOSIS — E785 Hyperlipidemia, unspecified: Secondary | ICD-10-CM | POA: Diagnosis not present

## 2020-06-25 DIAGNOSIS — F410 Panic disorder [episodic paroxysmal anxiety] without agoraphobia: Secondary | ICD-10-CM | POA: Diagnosis not present

## 2020-06-25 DIAGNOSIS — Z23 Encounter for immunization: Secondary | ICD-10-CM | POA: Diagnosis not present

## 2020-06-25 DIAGNOSIS — E1169 Type 2 diabetes mellitus with other specified complication: Secondary | ICD-10-CM

## 2020-06-25 DIAGNOSIS — I10 Essential (primary) hypertension: Secondary | ICD-10-CM | POA: Diagnosis not present

## 2020-06-25 LAB — BAYER DCA HB A1C WAIVED: HB A1C (BAYER DCA - WAIVED): 6.6 % (ref <7.0)

## 2020-06-25 MED ORDER — METFORMIN HCL ER 500 MG PO TB24: 1000.0000 mg | ORAL_TABLET | Freq: Every day | ORAL | 3 refills | Status: DC

## 2020-06-25 MED ORDER — EMPAGLIFLOZIN 10 MG PO TABS: 10.0000 mg | ORAL_TABLET | Freq: Every day | ORAL | 3 refills | Status: DC

## 2020-06-25 MED ORDER — SERTRALINE HCL 50 MG PO TABS: 50.0000 mg | ORAL_TABLET | Freq: Every day | ORAL | 3 refills | Status: DC

## 2020-06-25 MED ORDER — RIVAROXABAN 15 MG PO TABS: 15.0000 mg | ORAL_TABLET | Freq: Every day | ORAL | 3 refills | Status: DC

## 2020-06-25 MED ORDER — LOSARTAN POTASSIUM 50 MG PO TABS: 50.0000 mg | ORAL_TABLET | Freq: Every day | ORAL | 3 refills | Status: DC

## 2020-06-25 MED ORDER — PRAVASTATIN SODIUM 20 MG PO TABS: 20.0000 mg | ORAL_TABLET | Freq: Every day | ORAL | 3 refills | Status: DC

## 2020-06-25 MED ORDER — SILDENAFIL CITRATE 20 MG PO TABS: 20.0000 mg | ORAL_TABLET | ORAL | 1 refills | Status: DC | PRN

## 2020-06-25 NOTE — Progress Notes (Signed)
BP 109/64   Pulse (!) 57   Temp 98.2 F (36.8 C)   Ht 5\' 10"  (1.778 m)   Wt 237 lb (107.5 kg)   SpO2 96%   BMI 34.01 kg/m    Subjective:   Patient ID: David Tate, male    DOB: 1955-10-09, 64 y.o.   MRN: [id number]  HPI: David Tate is a 64 y.o. male presenting on 06/25/2020 for Medical Management of Chronic Issues and Diabetes   HPI Type 2 diabetes mellitus Patient comes in today for recheck of his diabetes. Patient has been currently taking Jardiance and Metformin. Patient is currently on an ACE inhibitor/ARB. Patient has not seen an ophthalmologist this year. Patient denies any issues with their feet. The symptom started onset as an adult hypertension and hyperlipidemia ARE RELATED TO DM   Hypertension Patient is currently on losartan, and their blood pressure today is 109/64. Patient denies any lightheadedness or dizziness. Patient denies headaches, blurred vision, chest pains, shortness of breath, or weakness. Denies any side effects from medication and is content with current medication.   Hyperlipidemia Patient is coming in for recheck of his hyperlipidemia. The patient is currently taking pravastatin. They deny any issues with myalgias or history of liver damage from it. They deny any focal numbness or weakness or chest pain.   History of DVT on Xarelto, denies any major issues.  Relevant past medical, surgical, family and social history reviewed and updated as indicated. Interim medical history since our last visit reviewed. Allergies and medications reviewed and updated.  Review of Systems  Constitutional: Negative for chills and fever.  Eyes: Negative for visual disturbance.  Respiratory: Negative for shortness of breath and wheezing.   Cardiovascular: Negative for chest pain and leg swelling.  Musculoskeletal: Negative for back pain and gait problem.  Skin: Negative for rash.  Neurological: Negative for dizziness, weakness and numbness.  Psychiatric/Behavioral:  Negative for dysphoric mood, self-injury, sleep disturbance and suicidal ideas. The patient is nervous/anxious.   All other systems reviewed and are negative.   Per HPI unless specifically indicated above   Allergies as of 06/25/2020      Reactions   Lisinopril Cough      Medication List       Accurate as of June 25, 2020 11:59 PM. If you have any questions, ask your nurse or doctor.        empagliflozin 10 MG Tabs tablet Commonly known as: Jardiance Take 1 tablet (10 mg total) by mouth daily before breakfast.   losartan 50 MG tablet Commonly known as: COZAAR Take 1 tablet (50 mg total) by mouth daily.   metFORMIN 500 MG 24 hr tablet Commonly known as: Glucophage XR Take 2 tablets (1,000 mg total) by mouth daily with breakfast.   pravastatin 20 MG tablet Commonly known as: PRAVACHOL Take 1 tablet (20 mg total) by mouth daily.   Rivaroxaban 15 MG Tabs tablet Commonly known as: XARELTO Take 1 tablet (15 mg total) by mouth daily with supper.   sertraline 50 MG tablet Commonly known as: ZOLOFT Take 1 tablet (50 mg total) by mouth daily.   sildenafil 20 MG tablet Commonly known as: REVATIO Take 1-3 tablets (20-60 mg total) by mouth as needed.        Objective:   BP 109/64   Pulse (!) 57   Temp 98.2 F (36.8 C)   Ht 5\' 10"  (1.778 m)   Wt 237 lb (107.5 kg)   SpO2 96%   BMI 34.01  kg/m   Wt Readings from Last 3 Encounters:  06/25/20 237 lb (107.5 kg)  03/21/20 235 lb (106.6 kg)  12/15/19 255 lb (115.7 kg)    Physical Exam Vitals and nursing note reviewed.  Constitutional:      General: He is not in acute distress.    Appearance: He is well-developed. He is not diaphoretic.  Eyes:     General: No scleral icterus.    Conjunctiva/sclera: Conjunctivae normal.  Neck:     Thyroid: No thyromegaly.  Cardiovascular:     Rate and Rhythm: Normal rate and regular rhythm.     Heart sounds: Normal heart sounds. No murmur heard.   Pulmonary:     Effort:  Pulmonary effort is normal. No respiratory distress.     Breath sounds: Normal breath sounds. No wheezing.  Musculoskeletal:        General: Normal range of motion.     Cervical back: Neck supple.  Lymphadenopathy:     Cervical: No cervical adenopathy.  Skin:    General: Skin is warm and dry.     Findings: No rash.  Neurological:     Mental Status: He is alert and oriented to person, place, and time.     Coordination: Coordination normal.  Psychiatric:        Mood and Affect: Mood is anxious. Mood is not depressed.        Behavior: Behavior normal.        Thought Content: Thought content does not include suicidal ideation. Thought content does not include suicidal plan.     Results for orders placed or performed in visit on 06/25/20  Bayer DCA Hb A1c Waived  Result Value Ref Range   HB A1C (BAYER DCA - WAIVED) 6.6 <7.0 %    Assessment & Plan:   Problem List Items Addressed This Visit      Cardiovascular and Mediastinum   Hypertension   Relevant Medications   losartan (COZAAR) 50 MG tablet   pravastatin (PRAVACHOL) 20 MG tablet   Rivaroxaban (XARELTO) 15 MG TABS tablet   sildenafil (REVATIO) 20 MG tablet     Endocrine   Type 2 diabetes mellitus with other specified complication (HCC) - Primary   Relevant Medications   losartan (COZAAR) 50 MG tablet   empagliflozin (JARDIANCE) 10 MG TABS tablet   metFORMIN (GLUCOPHAGE XR) 500 MG 24 hr tablet   pravastatin (PRAVACHOL) 20 MG tablet   Other Relevant Orders   Bayer DCA Hb A1c Waived (Completed)   Hyperlipidemia associated with type 2 diabetes mellitus (HCC)   Relevant Medications   losartan (COZAAR) 50 MG tablet   empagliflozin (JARDIANCE) 10 MG TABS tablet   metFORMIN (GLUCOPHAGE XR) 500 MG 24 hr tablet   pravastatin (PRAVACHOL) 20 MG tablet     Other   History of DVT (deep vein thrombosis)   Relevant Medications   Rivaroxaban (XARELTO) 15 MG TABS tablet    Other Visit Diagnoses    Flu vaccine need        Relevant Orders   Flu Vaccine QUAD High Dose(Fluad) (Completed)   Anxiety attack       Relevant Medications   sertraline (ZOLOFT) 50 MG tablet   Erectile dysfunction, unspecified erectile dysfunction type       Relevant Medications   sildenafil (REVATIO) 20 MG tablet    Continue current medication.  Continue sertraline, seems to be mostly handling and very infrequent anxiety attacks  Follow up plan: Return in about 3 months (around 09/24/2020),  or if symptoms worsen or fail to improve, for Diabetes follow-up.  Counseling provided for all of the vaccine components Orders Placed This Encounter  Procedures  . Flu Vaccine QUAD High Dose(Fluad)  . Bayer [facility] Hb A1c [place], MD [facility] 06/27/2020, 9:45 PM

## 2020-08-29 ENCOUNTER — Other Ambulatory Visit: Payer: Self-pay | Admitting: Family Medicine

## 2020-08-29 DIAGNOSIS — E1169 Type 2 diabetes mellitus with other specified complication: Secondary | ICD-10-CM

## 2020-09-17 ENCOUNTER — Encounter: Payer: Self-pay | Admitting: Family Medicine

## 2020-09-17 ENCOUNTER — Ambulatory Visit (INDEPENDENT_AMBULATORY_CARE_PROVIDER_SITE_OTHER): Payer: Medicare Other | Admitting: Family Medicine

## 2020-09-17 ENCOUNTER — Other Ambulatory Visit: Payer: Self-pay

## 2020-09-17 VITALS — BP 130/71 | HR 65 | Temp 98.0°F | Ht 70.0 in | Wt 238.0 lb

## 2020-09-17 DIAGNOSIS — E1169 Type 2 diabetes mellitus with other specified complication: Secondary | ICD-10-CM

## 2020-09-17 DIAGNOSIS — I10 Essential (primary) hypertension: Secondary | ICD-10-CM | POA: Diagnosis not present

## 2020-09-17 DIAGNOSIS — Z23 Encounter for immunization: Secondary | ICD-10-CM

## 2020-09-17 DIAGNOSIS — E785 Hyperlipidemia, unspecified: Secondary | ICD-10-CM

## 2020-09-17 LAB — BAYER DCA HB A1C WAIVED: HB A1C (BAYER DCA - WAIVED): 6.3 % (ref <7.0)

## 2020-09-17 NOTE — Progress Notes (Signed)
 BP 130/71   Pulse 65   Temp 98 F (36.7 C)   Ht 5' 10" (1.778 m)   Wt 238 lb (108 kg)   SpO2 94%   BMI 34.15 kg/m    Subjective:   Patient ID: David Tate, male    DOB: 07/10/1956, 64 y.o.   MRN: [id number]  HPI: David Tate is a 64 y.o. male presenting on 09/17/2020 for Medical Management of Chronic Issues and Diabetes   HPI Type 2 diabetes mellitus Patient comes in today for recheck of his diabetes. Patient has been currently taking Jardiance and Metformin. Patient is currently on an ACE inhibitor/ARB. Patient has seen an ophthalmologist this year. Patient denies any issues with their feet. The symptom started onset as an adult attention hyperlipidemia and morbid obesity ARE RELATED TO DM   Hypertension Patient is currently on losartan, and their blood pressure today is 130/71. Patient denies any lightheadedness or dizziness. Patient denies headaches, blurred vision, chest pains, shortness of breath, or weakness. Denies any side effects from medication and is content with current medication.   Hyperlipidemia Patient is coming in for recheck of his hyperlipidemia. The patient is currently taking pravastatin. They deny any issues with myalgias or history of liver damage from it. They deny any focal numbness or weakness or chest pain.   Patient has a history of DVTs and is on Xarelto But that is been doing good for him, he denies any major bleeding episodes.  Patient has been getting the occasional lightheadedness and spinning sensation has been going on over the past couple weeks, is not every day but every now and then he will bend or twist in a certain way any of the like the room is moving on him and he feels a little lightheaded.  He does admit he has not been drinking as much fluids.  His blood pressures look good today both sitting and standing and lying.  Patient has not checked his blood sugar or his blood pressure during 1 of these episodes.  Patient's diabetes and  hypertension and hyperlipidemia are more complicated by the patient's morbid obesity.  Discussed weight loss and lifestyle modification and exercise with the patient.   Relevant past medical, surgical, family and social history reviewed and updated as indicated. Interim medical history since our last visit reviewed. Allergies and medications reviewed and updated.  Review of Systems  Constitutional: Negative for chills and fever.  Eyes: Negative for visual disturbance.  Respiratory: Negative for shortness of breath and wheezing.   Cardiovascular: Negative for chest pain and leg swelling.  Musculoskeletal: Negative for back pain and gait problem.  Skin: Negative for rash.  Neurological: Positive for dizziness and light-headedness. Negative for seizures, weakness and numbness.  All other systems reviewed and are negative.   Per HPI unless specifically indicated above   Allergies as of 09/17/2020      Reactions   Lisinopril Cough      Medication List       Accurate as of September 17, 2020 11:21 AM. If you have any questions, ask your nurse or doctor.        empagliflozin 10 MG Tabs tablet Commonly known as: Jardiance Take 1 tablet (10 mg total) by mouth daily before breakfast.   losartan 50 MG tablet Commonly known as: COZAAR Take 1 tablet (50 mg total) by mouth daily.   metFORMIN 500 MG 24 hr tablet Commonly known as: GLUCOPHAGE-XR TAKE 2 TABLETS BY MOUTH TWICE A DAY   pravastatin   20 MG tablet Commonly known as: PRAVACHOL Take 1 tablet (20 mg total) by mouth daily.   Rivaroxaban 15 MG Tabs tablet Commonly known as: XARELTO Take 1 tablet (15 mg total) by mouth daily with supper.   sertraline 50 MG tablet Commonly known as: ZOLOFT Take 1 tablet (50 mg total) by mouth daily.   sildenafil 20 MG tablet Commonly known as: REVATIO Take 1-3 tablets (20-60 mg total) by mouth as needed.        Objective:   BP 130/71   Pulse 65   Temp 98 F (36.7 C)   Ht 5' 10"  (1.778 m)   Wt 238 lb (108 kg)   SpO2 94%   BMI 34.15 kg/m   Wt Readings from Last 3 Encounters:  09/17/20 238 lb (108 kg)  06/25/20 237 lb (107.5 kg)  03/21/20 235 lb (106.6 kg)    Physical Exam Vitals and nursing note reviewed.  Constitutional:      General: He is not in acute distress.    Appearance: He is well-developed and well-nourished. He is not diaphoretic.  Eyes:     General: No scleral icterus.    Extraocular Movements: EOM normal.     Conjunctiva/sclera: Conjunctivae normal.  Neck:     Thyroid: No thyromegaly.  Cardiovascular:     Rate and Rhythm: Normal rate and regular rhythm.     Pulses: Intact distal pulses.     Heart sounds: Normal heart sounds. No murmur heard.   Pulmonary:     Effort: Pulmonary effort is normal. No respiratory distress.     Breath sounds: Normal breath sounds. No wheezing.  Musculoskeletal:        General: No edema. Normal range of motion.     Cervical back: Neck supple.  Lymphadenopathy:     Cervical: No cervical adenopathy.  Skin:    General: Skin is warm and dry.     Findings: No rash.  Neurological:     Mental Status: He is alert and oriented to person, place, and time.     Coordination: Coordination normal.  Psychiatric:        Mood and Affect: Mood and affect normal.        Behavior: Behavior normal.     a1c is 6.3  Assessment & Plan:   Problem List Items Addressed This Visit      Cardiovascular and Mediastinum   Hypertension   Relevant Orders   CMP14+EGFR     Endocrine   Type 2 diabetes mellitus with other specified complication (HCC) - Primary   Relevant Orders   Bayer DCA Hb A1c Waived (Completed)   CBC with Differential/Platelet   Hyperlipidemia associated with type 2 diabetes mellitus (HCC)   Relevant Orders   Lipid panel     Other   Morbid obesity (HCC)      A1c looks great, blood sugar looks good, blood pressure looks good, will monitor for the dizziness and have him check his blood pressure and  his blood sugar when he feels lightheaded, increase fluids and monitor and let us know. Follow up plan: Return in about 3 months (around 12/16/2020), or if symptoms worsen or fail to improve, for Diabetes recheck.  Counseling provided for all of the vaccine components Orders Placed This Encounter  Procedures  . Bayer DCA Hb A1c Waived    Joshua Dettinger, MD [facility] 09/17/2020, 11:21 AM     

## 2020-09-18 LAB — CBC WITH DIFFERENTIAL/PLATELET
Basophils Absolute: 0.1 10*3/uL (ref 0.0–0.2)
Basos: 1 %
EOS (ABSOLUTE): 0.2 10*3/uL (ref 0.0–0.4)
Eos: 2 %
Hematocrit: 49.1 % (ref 37.5–51.0)
Hemoglobin: 16.5 g/dL (ref 13.0–17.7)
Immature Grans (Abs): 0.2 10*3/uL — ABNORMAL HIGH (ref 0.0–0.1)
Immature Granulocytes: 1 %
Lymphocytes Absolute: 3.1 10*3/uL (ref 0.7–3.1)
Lymphs: 27 %
MCH: 28.6 pg (ref 26.6–33.0)
MCHC: 33.6 g/dL (ref 31.5–35.7)
MCV: 85 fL (ref 79–97)
Monocytes Absolute: 0.8 10*3/uL (ref 0.1–0.9)
Monocytes: 7 %
Neutrophils Absolute: 7.2 10*3/uL — ABNORMAL HIGH (ref 1.4–7.0)
Neutrophils: 62 %
Platelets: 205 10*3/uL (ref 150–450)
RBC: 5.76 x10E6/uL (ref 4.14–5.80)
RDW: 12.4 % (ref 11.6–15.4)
WBC: 11.5 10*3/uL — ABNORMAL HIGH (ref 3.4–10.8)

## 2020-09-18 LAB — CMP14+EGFR
ALT: 16 IU/L (ref 0–44)
AST: 16 IU/L (ref 0–40)
Albumin/Globulin Ratio: 1.5 (ref 1.2–2.2)
Albumin: 4.3 g/dL (ref 3.8–4.8)
Alkaline Phosphatase: 105 IU/L (ref 44–121)
BUN/Creatinine Ratio: 25 — ABNORMAL HIGH (ref 10–24)
BUN: 31 mg/dL — ABNORMAL HIGH (ref 8–27)
Bilirubin Total: 0.5 mg/dL (ref 0.0–1.2)
CO2: 26 mmol/L (ref 20–29)
Calcium: 9.1 mg/dL (ref 8.6–10.2)
Chloride: 99 mmol/L (ref 96–106)
Creatinine, Ser: 1.26 mg/dL (ref 0.76–1.27)
GFR calc Af Amer: 68 mL/min/{1.73_m2} (ref >59)
GFR calc non Af Amer: 59 mL/min/{1.73_m2} — ABNORMAL LOW (ref >59)
Globulin, Total: 2.8 g/dL (ref 1.5–4.5)
Glucose: 123 mg/dL — ABNORMAL HIGH (ref 65–99)
Potassium: 4.9 mmol/L (ref 3.5–5.2)
Sodium: 138 mmol/L (ref 134–144)
Total Protein: 7.1 g/dL (ref 6.0–8.5)

## 2020-09-18 LAB — LIPID PANEL
Chol/HDL Ratio: 4.5 ratio (ref 0.0–5.0)
Cholesterol, Total: 161 mg/dL (ref 100–199)
HDL: 36 mg/dL — ABNORMAL LOW (ref >39)
LDL Chol Calc (NIH): 103 mg/dL — ABNORMAL HIGH (ref 0–99)
Triglycerides: 120 mg/dL (ref 0–149)
VLDL Cholesterol Cal: 22 mg/dL (ref 5–40)

## 2020-09-27 ENCOUNTER — Encounter: Payer: Self-pay | Admitting: *Deleted

## 2020-10-15 ENCOUNTER — Telehealth: Payer: Self-pay

## 2020-10-15 DIAGNOSIS — I10 Essential (primary) hypertension: Secondary | ICD-10-CM

## 2020-10-15 MED ORDER — LOSARTAN POTASSIUM 50 MG PO TABS: 100.0000 mg | ORAL_TABLET | Freq: Every day | ORAL | 0 refills | Status: DC

## 2020-10-15 NOTE — Telephone Encounter (Signed)
Patient aware, new prescription for Losartan 50 mg, 2 per day, sent to [facility] [facility].

## 2020-10-15 NOTE — Telephone Encounter (Signed)
Patient reports that he has been taking his Losartan 50 mg, 2 per day for quite some time now.  He has been running out of medication early and having to request refills because the direction on the bottle say one per day.  The patient reports you had changed this one two daily quite a while back but he could not remember when.  I looked back through office visit notes and refills and it appears that on 08/25/19 the instruction were to take two per day.  There was a refill sent in on 11/16/19 as two per day but then somehow on 02/08/20 it changed to one per day.  Is it okay to refill as two per day and send script to [facility]?

## 2020-10-15 NOTE — Telephone Encounter (Signed)
Yes go ahead and refill the losartan for 2/day, I do not know why it would have gotten changed and please send in refills enough to make sure he gets through to his next appointment.

## 2020-12-07 IMAGING — US VENOUS DOPPLER ULTRASOUND OF LEFT LOWER EXTREMITY
1 series · 14 of 24 positions shown · non-contrast
Comparison: None

CLINICAL DATA: Pain and edema, redness x4 days

EXAM:
LEFT LOWER EXTREMITY VENOUS DOPPLER ULTRASOUND
TECHNIQUE: Gray-scale sonography with compression, as well as color and duplex
ultrasound, were performed to evaluate the deep venous system from
the level of the common femoral vein through the popliteal and
proximal calf veins.

[Series 1: venous doppler ultrasound of left lower extremity · 14 of 36 slices shown]
[im 1/36]
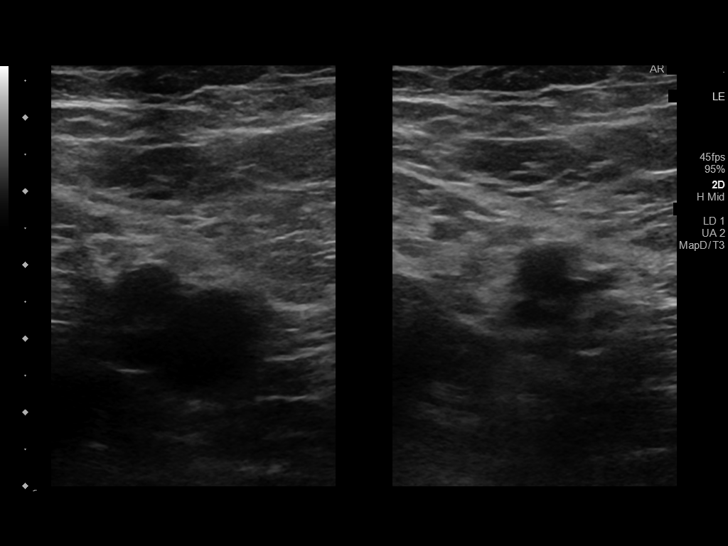
[im 4/36]
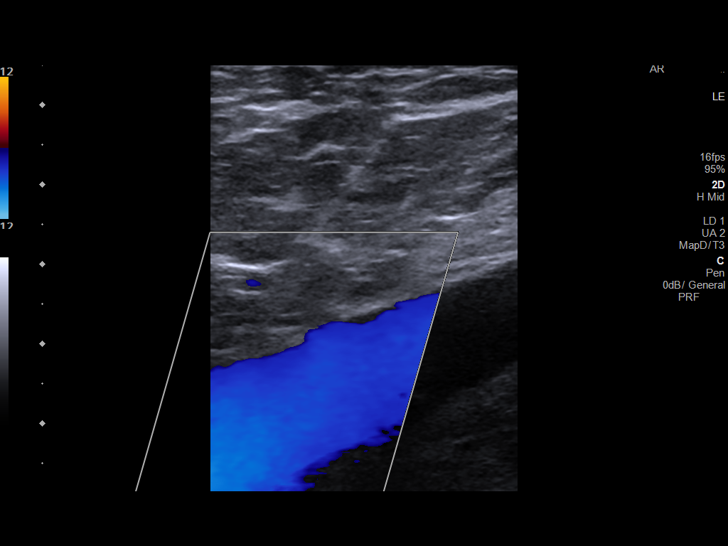
[im 7/36]
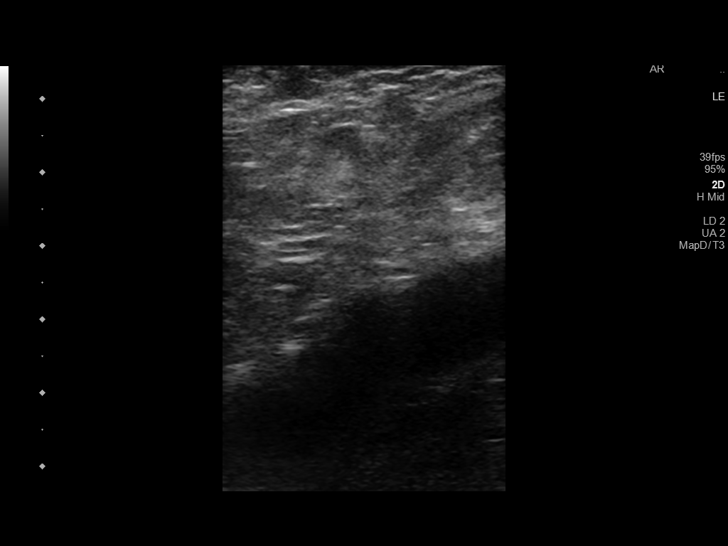
[im 10/36]
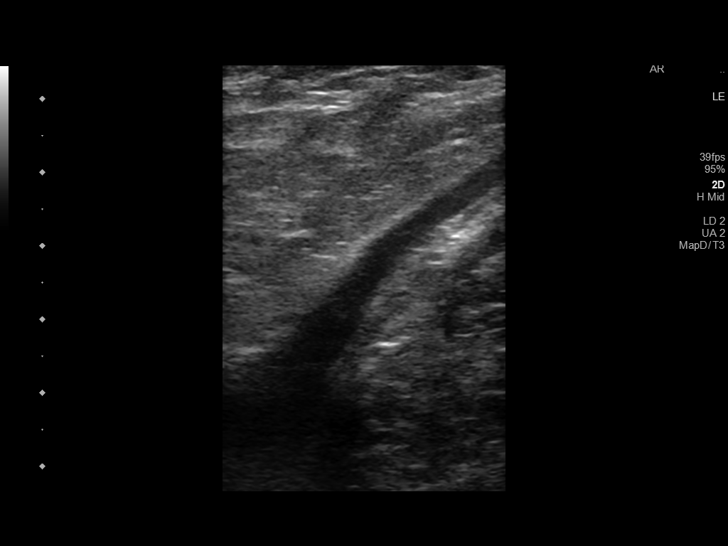
[im 11/36]
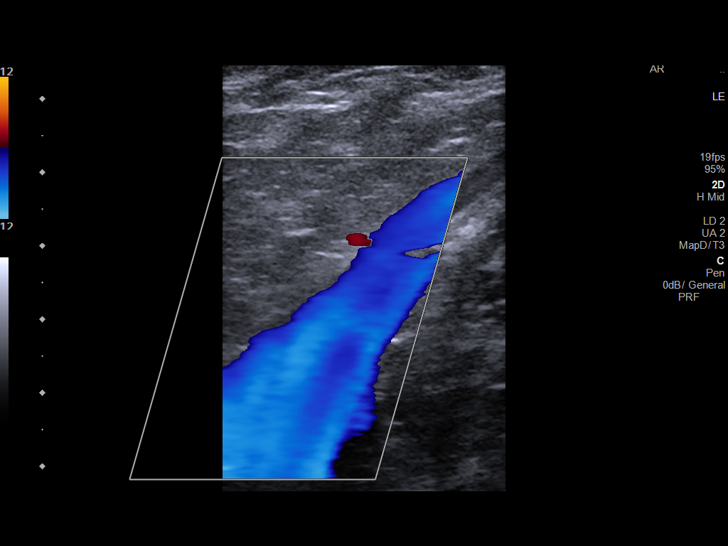
[im 14/36]
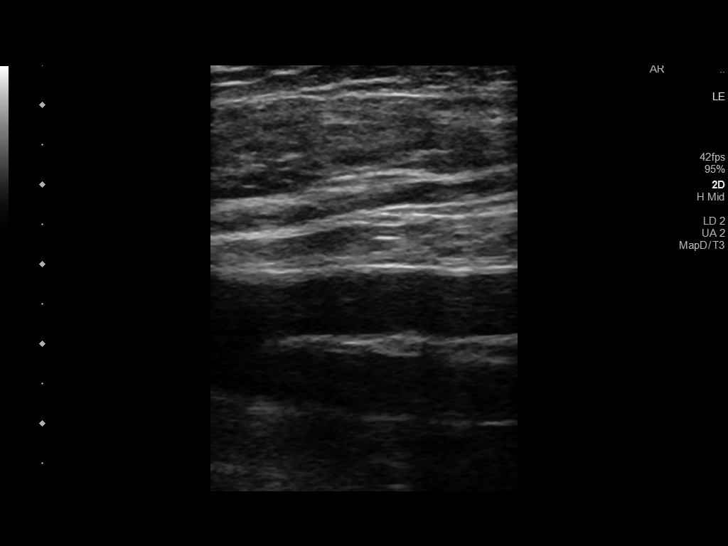
[im 17/36]
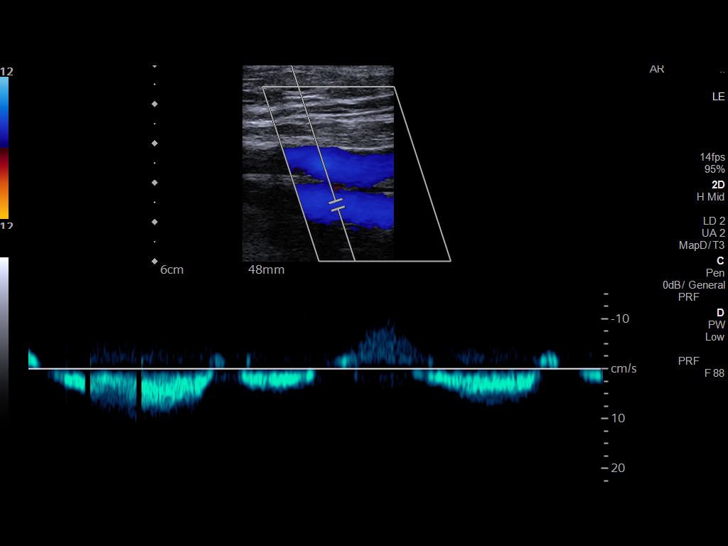
[im 19/36]
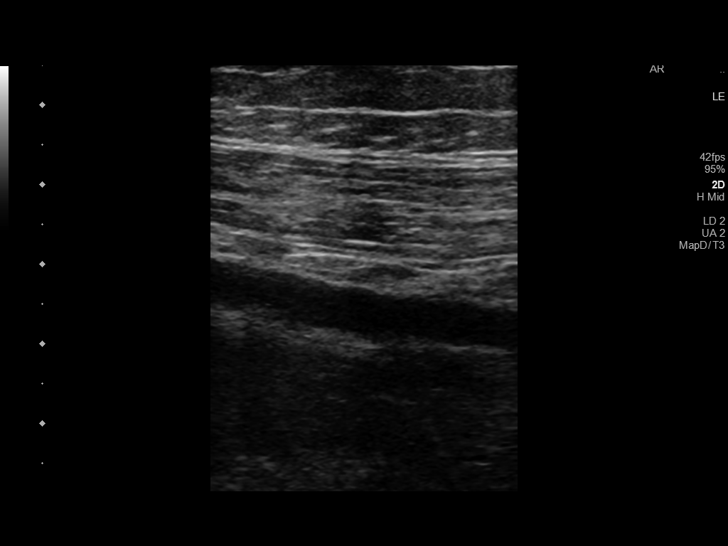
[im 22/36]
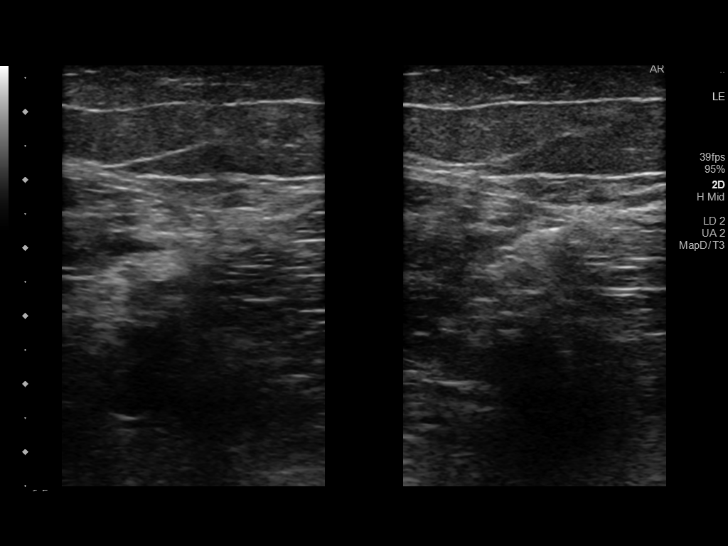
[im 25/36]
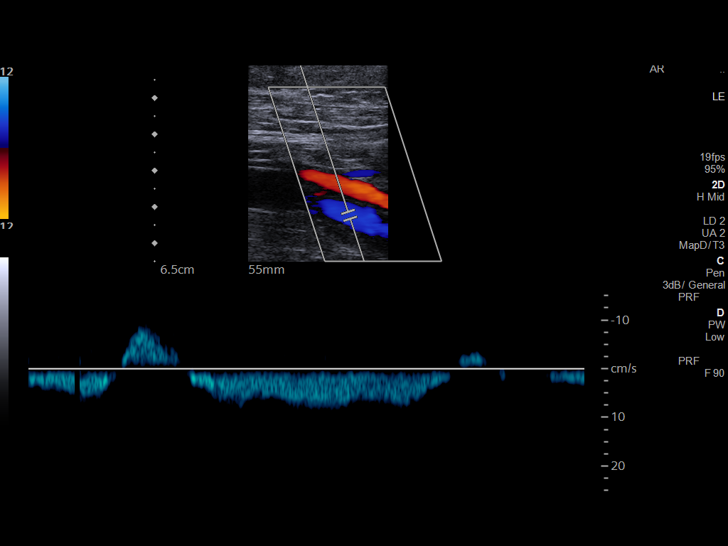
[im 28/36]
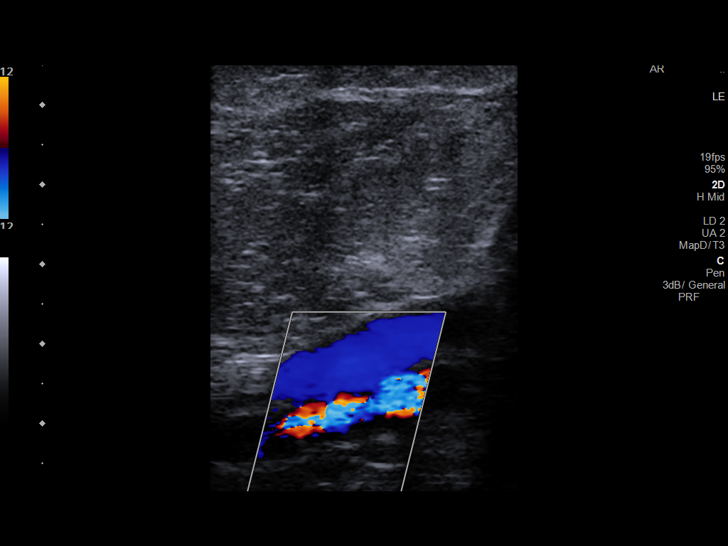
[im 29/36]
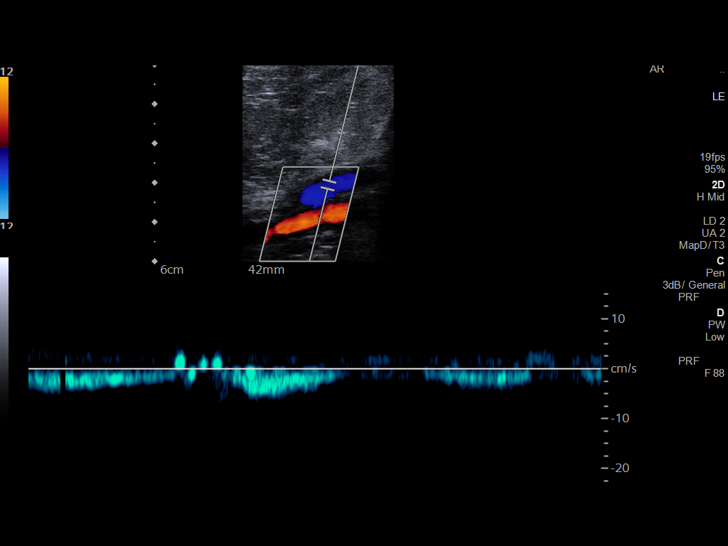
[im 32/36]
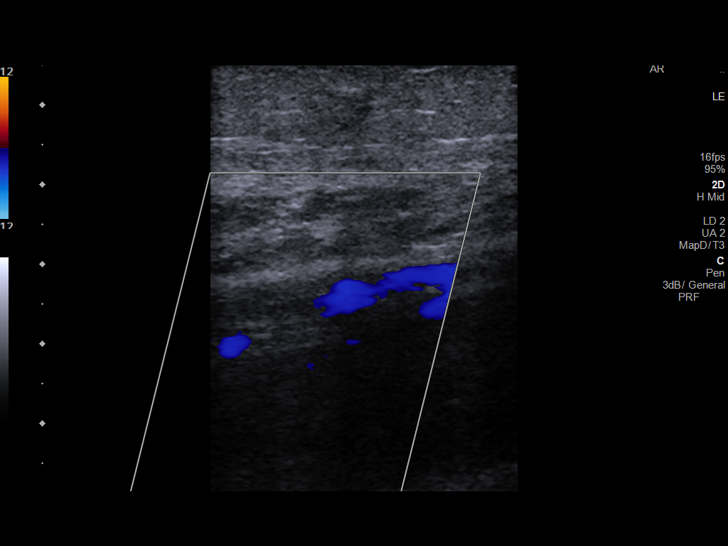
[im 36/36]
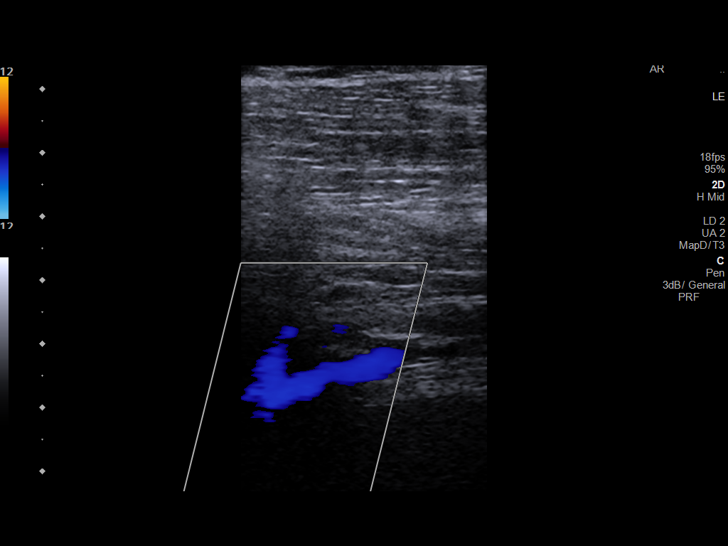

[14 of 24 positions shown; findings below may reference images not displayed]

FINDINGS: Normal compressibility of the common femoral, superficial femoral,
and popliteal veins, as well as the proximal calf veins. No filling
defects to suggest DVT on grayscale or color Doppler imaging.
Doppler waveforms show normal direction of venous flow, normal
respiratory phasicity and response to augmentation. Survey views of
the contralateral common femoral vein are unremarkable.
IMPRESSION: No femoropopliteal and no calf DVT in the visualized calf veins. If
clinical symptoms are inconsistent or if there are persistent or
worsening symptoms, further imaging (possibly involving the iliac
veins) may be warranted.

## 2020-12-19 ENCOUNTER — Ambulatory Visit (INDEPENDENT_AMBULATORY_CARE_PROVIDER_SITE_OTHER): Payer: Medicare Other | Admitting: Family Medicine

## 2020-12-19 ENCOUNTER — Encounter: Payer: Self-pay | Admitting: Family Medicine

## 2020-12-19 ENCOUNTER — Other Ambulatory Visit: Payer: Self-pay

## 2020-12-19 VITALS — BP 126/76 | HR 70 | Ht 70.0 in | Wt 244.0 lb

## 2020-12-19 DIAGNOSIS — I10 Essential (primary) hypertension: Secondary | ICD-10-CM

## 2020-12-19 DIAGNOSIS — E1169 Type 2 diabetes mellitus with other specified complication: Secondary | ICD-10-CM | POA: Diagnosis not present

## 2020-12-19 DIAGNOSIS — E785 Hyperlipidemia, unspecified: Secondary | ICD-10-CM | POA: Diagnosis not present

## 2020-12-19 DIAGNOSIS — Z23 Encounter for immunization: Secondary | ICD-10-CM

## 2020-12-19 LAB — BAYER DCA HB A1C WAIVED: HB A1C (BAYER DCA - WAIVED): 6.7 % (ref <7.0)

## 2020-12-19 NOTE — Progress Notes (Signed)
BP 126/76   Pulse 70   Ht 5\' 10"  (1.778 m)   Wt 244 lb (110.7 kg)   SpO2 92%   BMI 35.01 kg/m    Subjective:   Patient ID: David Tate, male    DOB: [birth year], 65 y.o.   MRN: [id number]  HPI: David Tate is a 65 y.o. male presenting on 12/19/2020 for Medical Management of Chronic Issues and Diabetes   HPI Type 2 diabetes mellitus Patient comes in today for recheck of his diabetes. Patient has been currently taking 6.7, refocus on diet, continue Metformin and Jardiance. Patient is currently on an ACE inhibitor/ARB. Patient has not seen an ophthalmologist this year. Patient denies any issues with their feet. The symptom started onset as an adult hypertension hyperlipidemia ARE RELATED TO DM   Hypertension Patient is currently on losartan, and their blood pressure today is 126/76. Patient denies any lightheadedness or dizziness. Patient denies headaches, blurred vision, chest pains, shortness of breath, or weakness. Denies any side effects from medication and is content with current medication.   Hyperlipidemia Patient is coming in for recheck of his hyperlipidemia. The patient is currently taking pravastatin. They deny any issues with myalgias or history of liver damage from it. They deny any focal numbness or weakness or chest pain.   Relevant past medical, surgical, family and social history reviewed and updated as indicated. Interim medical history since our last visit reviewed. Allergies and medications reviewed and updated.  Review of Systems  Constitutional: Negative for chills and fever.  Eyes: Negative for discharge.  Respiratory: Negative for shortness of breath and wheezing.   Cardiovascular: Negative for chest pain and leg swelling.  Musculoskeletal: Negative for back pain and gait problem.  Skin: Negative for rash.  Neurological: Negative for dizziness, weakness and numbness.  All other systems reviewed and are negative.   Per HPI unless specifically indicated  above   Allergies as of 12/19/2020      Reactions   Lisinopril Cough      Medication List       Accurate as of December 19, 2020 11:33 AM. If you have any questions, ask your nurse or doctor.        empagliflozin 10 MG Tabs tablet Commonly known as: Jardiance Take 1 tablet (10 mg total) by mouth daily before breakfast.   losartan 50 MG tablet Commonly known as: COZAAR Take 2 tablets (100 mg total) by mouth daily.   metFORMIN 500 MG 24 hr tablet Commonly known as: GLUCOPHAGE-XR TAKE 2 TABLETS BY MOUTH TWICE A DAY   pravastatin 20 MG tablet Commonly known as: PRAVACHOL Take 1 tablet (20 mg total) by mouth daily.   Rivaroxaban 15 MG Tabs tablet Commonly known as: XARELTO Take 1 tablet (15 mg total) by mouth daily with supper.   sertraline 50 MG tablet Commonly known as: ZOLOFT Take 1 tablet (50 mg total) by mouth daily.   sildenafil 20 MG tablet Commonly known as: REVATIO Take 1-3 tablets (20-60 mg total) by mouth as needed.        Objective:   BP 126/76   Pulse 70   Ht 5\' 10"  (1.778 m)   Wt 244 lb (110.7 kg)   SpO2 92%   BMI 35.01 kg/m   Wt Readings from Last 3 Encounters:  12/19/20 244 lb (110.7 kg)  09/17/20 238 lb (108 kg)  06/25/20 237 lb (107.5 kg)    Physical Exam Vitals and nursing note reviewed.  Constitutional:  General: He is not in acute distress.    Appearance: He is well-developed. He is not diaphoretic.  Eyes:     General: No scleral icterus.    Conjunctiva/sclera: Conjunctivae normal.  Neck:     Thyroid: No thyromegaly.  Cardiovascular:     Rate and Rhythm: Normal rate and regular rhythm.     Heart sounds: Normal heart sounds. No murmur heard.   Pulmonary:     Effort: Pulmonary effort is normal. No respiratory distress.     Breath sounds: Normal breath sounds. No wheezing.  Musculoskeletal:        General: Normal range of motion.     Cervical back: Neck supple.  Lymphadenopathy:     Cervical: No cervical adenopathy.   Skin:    General: Skin is warm and dry.     Findings: No rash.  Neurological:     Mental Status: He is alert and oriented to person, place, and time.     Coordination: Coordination normal.  Psychiatric:        Behavior: Behavior normal.       Assessment & Plan:   Problem List Items Addressed This Visit      Cardiovascular and Mediastinum   Hypertension     Endocrine   Type 2 diabetes mellitus with other specified complication ([place]) - Primary   Relevant Orders   Bayer DCA Hb A1c Waived   Hyperlipidemia associated with type 2 diabetes mellitus ([place])    Other Visit Diagnoses    Need for pneumococcal vaccine       Relevant Orders   Pneumococcal conjugate vaccine 13-valent (Completed)      Diabetes and hypertension cholesterol looks good, no changes, A1c is up slightly at 6.7 but no medication changes, mainly focus on diet and exercise. Follow up plan: Return in about 3 months (around 03/21/2021), or if symptoms worsen or fail to improve, for Diabetes recheck.  Counseling provided for all of the vaccine components Orders Placed This Encounter  Procedures  . Pneumococcal conjugate vaccine 13-valent  . Bayer [facility] Hb A1c [place], MD [facility] 12/19/2020, 11:33 AM

## 2021-01-07 ENCOUNTER — Other Ambulatory Visit: Payer: Self-pay | Admitting: Family Medicine

## 2021-01-07 DIAGNOSIS — I10 Essential (primary) hypertension: Secondary | ICD-10-CM

## 2021-01-28 ENCOUNTER — Ambulatory Visit (INDEPENDENT_AMBULATORY_CARE_PROVIDER_SITE_OTHER): Payer: Medicare Other

## 2021-01-28 VITALS — Ht 70.0 in | Wt 237.0 lb

## 2021-01-28 DIAGNOSIS — Z87891 Personal history of nicotine dependence: Secondary | ICD-10-CM

## 2021-01-28 DIAGNOSIS — Z0000 Encounter for general adult medical examination without abnormal findings: Secondary | ICD-10-CM

## 2021-01-28 NOTE — Patient Instructions (Signed)
David Tate , Thank you for taking time to come for your Medicare Wellness Visit. I appreciate your ongoing commitment to your health goals. Please review the following plan we discussed and let me know if I can assist you in the future.   Screening recommendations/referrals: Colonoscopy: Cologuard done 12/21/2018 - Repeat every 3 years Recommended yearly ophthalmology/optometry visit for glaucoma screening and checkup Recommended yearly dental visit for hygiene and checkup  Vaccinations: Influenza vaccine: Done 09/17/2020 - Repeat every Fall Pneumococcal vaccine: Prevnar-13 done 12/19/2020 - Pneumovax-23 due next year Tdap vaccine: Done in [place] - We need date Shingles vaccine: Shingrix discussed. Please contact your pharmacy for coverage information.    Covid-19: Done 12/30/2019 & 01/31/2020 - Due for booster  Advanced directives: Advance directive discussed with you today. I have provided a copy for you to complete at home and have notarized. Once this is complete please bring a copy in to our office so we can scan it into your chart.  Conditions/risks identified: Aim for 30 minutes of exercise or brisk walking each day, drink 6-8 glasses of water and eat lots of fruits and vegetables.  Next appointment: Follow up in one year for your annual wellness visit.   Preventive Care 67 Years and Older, Male  Preventive care refers to lifestyle choices and visits with your health care provider that can promote health and wellness. What does preventive care include?  A yearly physical exam. This is also called an annual well check.  Dental exams once or twice a year.  Routine eye exams. Ask your health care provider how often you should have your eyes checked.  Personal lifestyle choices, including:  Daily care of your teeth and gums.  Regular physical activity.  Eating a healthy diet.  Avoiding tobacco and drug use.  Limiting alcohol use.  Practicing safe sex.  Taking low doses of  aspirin every day.  Taking vitamin and mineral supplements as recommended by your health care provider. What happens during an annual well check? The services and screenings done by your health care provider during your annual well check will depend on your age, overall health, lifestyle risk factors, and family history of disease. Counseling  Your health care provider may ask you questions about your:  Alcohol use.  Tobacco use.  Drug use.  Emotional well-being.  Home and relationship well-being.  Sexual activity.  Eating habits.  History of falls.  Memory and ability to understand (cognition).  Work and work Statistician. Screening  You may have the following tests or measurements:  Height, weight, and BMI.  Blood pressure.  Lipid and cholesterol levels. These may be checked every 5 years, or more frequently if you are over 67 years old.  Skin check.  Lung cancer screening. You may have this screening every year starting at age 15 if you have a 30-pack-year history of smoking and currently smoke or have quit within the past 15 years.  Fecal occult blood test (FOBT) of the stool. You may have this test every year starting at age 17.  Flexible sigmoidoscopy or colonoscopy. You may have a sigmoidoscopy every 5 years or a colonoscopy every 10 years starting at age 19.  Prostate cancer screening. Recommendations will vary depending on your family history and other risks.  Hepatitis C blood test.  Hepatitis B blood test.  Sexually transmitted disease (STD) testing.  Diabetes screening. This is done by checking your blood sugar (glucose) after you have not eaten for a while (fasting). You may have  this done every 1-3 years.  Abdominal aortic aneurysm (AAA) screening. You may need this if you are a current or former smoker.  Osteoporosis. You may be screened starting at age 5 if you are at high risk. Talk with your health care provider about your test results,  treatment options, and if necessary, the need for more tests. Vaccines  Your health care provider may recommend certain vaccines, such as:  Influenza vaccine. This is recommended every year.  Tetanus, diphtheria, and acellular pertussis (Tdap, Td) vaccine. You may need a Td booster every 10 years.  Zoster vaccine. You may need this after age 80.  Pneumococcal 13-valent conjugate (PCV13) vaccine. One dose is recommended after age 57.  Pneumococcal polysaccharide (PPSV23) vaccine. One dose is recommended after age 51. Talk to your health care provider about which screenings and vaccines you need and how often you need them. This information is not intended to replace advice given to you by your health care provider. Make sure you discuss any questions you have with your health care provider. Document Released: 10/19/2015 Document Revised: 06/11/2016 Document Reviewed: 07/24/2015 Elsevier Interactive Patient Education  2017 [employer] Prevention in the Home Falls can cause injuries. They can happen to people of all ages. There are many things you can do to make your home safe and to help prevent falls. What can I do on the outside of my home?  Regularly fix the edges of walkways and driveways and fix any cracks.  Remove anything that might make you trip as you walk through a door, such as a raised step or threshold.  Trim any bushes or trees on the path to your home.  Use bright outdoor lighting.  Clear any walking paths of anything that might make someone trip, such as rocks or tools.  Regularly check to see if handrails are loose or broken. Make sure that both sides of any steps have handrails.  Any raised decks and porches should have guardrails on the edges.  Have any leaves, snow, or ice cleared regularly.  Use sand or salt on walking paths during winter.  Clean up any spills in your garage right away. This includes oil or grease spills. What can I do in the  bathroom?  Use night lights.  Install grab bars by the toilet and in the tub and shower. Do not use towel bars as grab bars.  Use non-skid mats or decals in the tub or shower.  If you need to sit down in the shower, use a plastic, non-slip stool.  Keep the floor dry. Clean up any water that spills on the floor as soon as it happens.  Remove soap buildup in the tub or shower regularly.  Attach bath mats securely with double-sided non-slip rug tape.  Do not have throw rugs and other things on the floor that can make you trip. What can I do in the bedroom?  Use night lights.  Make sure that you have a light by your bed that is easy to reach.  Do not use any sheets or blankets that are too big for your bed. They should not hang down onto the floor.  Have a firm chair that has side arms. You can use this for support while you get dressed.  Do not have throw rugs and other things on the floor that can make you trip. What can I do in the kitchen?  Clean up any spills right away.  Avoid walking on wet floors.  Keep items that you use a lot in easy-to-reach places.  If you need to reach something above you, use a strong step stool that has a grab bar.  Keep electrical cords out of the way.  Do not use floor polish or wax that makes floors slippery. If you must use wax, use non-skid floor wax.  Do not have throw rugs and other things on the floor that can make you trip. What can I do with my stairs?  Do not leave any items on the stairs.  Make sure that there are handrails on both sides of the stairs and use them. Fix handrails that are broken or loose. Make sure that handrails are as long as the stairways.  Check any carpeting to make sure that it is firmly attached to the stairs. Fix any carpet that is loose or worn.  Avoid having throw rugs at the top or bottom of the stairs. If you do have throw rugs, attach them to the floor with carpet tape.  Make sure that you have a  light switch at the top of the stairs and the bottom of the stairs. If you do not have them, ask someone to add them for you. What else can I do to help prevent falls?  Wear shoes that:  Do not have high heels.  Have rubber bottoms.  Are comfortable and fit you well.  Are closed at the toe. Do not wear sandals.  If you use a stepladder:  Make sure that it is fully opened. Do not climb a closed stepladder.  Make sure that both sides of the stepladder are locked into place.  Ask someone to hold it for you, if possible.  Clearly mark and make sure that you can see:  Any grab bars or handrails.  First and last steps.  Where the edge of each step is.  Use tools that help you move around (mobility aids) if they are needed. These include:  Canes.  Walkers.  Scooters.  Crutches.  Turn on the lights when you go into a dark area. Replace any light bulbs as soon as they burn out.  Set up your furniture so you have a clear path. Avoid moving your furniture around.  If any of your floors are uneven, fix them.  If there are any pets around you, be aware of where they are.  Review your medicines with your doctor. Some medicines can make you feel dizzy. This can increase your chance of falling. Ask your doctor what other things that you can do to help prevent falls. This information is not intended to replace advice given to you by your health care provider. Make sure you discuss any questions you have with your health care provider. Document Released: 07/19/2009 Document Revised: 02/28/2016 Document Reviewed: 10/27/2014 Elsevier Interactive Patient Education  2017 [employer].

## 2021-01-28 NOTE — Progress Notes (Signed)
Subjective:   David Tate is a 65 y.o. male who presents for an Initial Medicare Annual Wellness Visit.  Virtual Visit via Telephone Note  I connected with  David Tate on 01/28/21 at  4:15 PM EDT by telephone and verified that I am speaking with the correct person using two identifiers.  Location: Patient: Home Provider: [facility] Persons participating in the virtual visit: patient/Nurse Health Advisor   I discussed the limitations, risks, security and privacy concerns of performing an evaluation and management service by telephone and the availability of in person appointments. The patient expressed understanding and agreed to proceed.  Interactive audio and video telecommunications were attempted between this nurse and patient, however failed, due to patient having technical difficulties OR patient did not have access to video capability.  We continued and completed visit with audio only.  Some vital signs may be absent or patient reported.   Yusif Gnau E Tydus Sanmiguel, LPN   Review of Systems     Cardiac Risk Factors include: advanced age (>66men, >75 women);diabetes mellitus;obesity (BMI >30kg/m2);hypertension;male gender;smoking/ tobacco exposure     Objective:    Today's Vitals   01/28/21 1544  Weight: 237 lb (107.5 kg)  Height: 5\' 10"  (1.778 m)   Body mass index is 34.01 kg/m.  Advanced Directives 01/28/2021 05/02/2019 12/09/2018  Does Patient Have a Medical Advance Directive? No No No  Would patient like information on creating a medical advance directive? Yes (MAU/Ambulatory/Procedural Areas - Information given) - -    Current Medications (verified) Outpatient Encounter Medications as of 01/28/2021  Medication Sig  . empagliflozin (JARDIANCE) 10 MG TABS tablet Take 1 tablet (10 mg total) by mouth daily before breakfast.  . losartan (COZAAR) 50 MG tablet TAKE 2 TABLETS BY MOUTH EVERY DAY  . metFORMIN (GLUCOPHAGE-XR) 500 MG 24 hr tablet TAKE 2 TABLETS BY MOUTH TWICE A DAY  .  pravastatin (PRAVACHOL) 20 MG tablet Take 1 tablet (20 mg total) by mouth daily.  . Rivaroxaban (XARELTO) 15 MG TABS tablet Take 1 tablet (15 mg total) by mouth daily with supper.  . sertraline (ZOLOFT) 50 MG tablet Take 1 tablet (50 mg total) by mouth daily.  . sildenafil (REVATIO) 20 MG tablet Take 1-3 tablets (20-60 mg total) by mouth as needed.   No facility-administered encounter medications on file as of 01/28/2021.    Allergies (verified) Lisinopril   History: Past Medical History:  Diagnosis Date  . Diabetes mellitus without complication ([place])   . H/O blood clots    Past Surgical History:  Procedure Laterality Date  . LEG SURGERY Left    Family History  Problem Relation Age of Onset  . COPD Mother    Social History   Socioeconomic History  . Marital status: Divorced    Spouse name: Not on file  . Number of children: 2  . Years of education: Not on file  . Highest education level: Not on file  Occupational History    Comment: retired  Tobacco Use  . Smoking status: Former Smoker    Packs/day: 1.00    Years: 50.00    Pack years: 50.00    Types: Cigarettes    Quit date: 01/2017    Years since quitting: 4.0  . Smokeless tobacco: Never Used  . Tobacco comment: e cigarrettes now and coming down off of them  Vaping Use  . Vaping Use: Every day  Substance and Sexual Activity  . Alcohol use: Yes    Alcohol/week: 2.0 standard drinks  Types: 2 Standard drinks or equivalent per week    Comment: occ - once per month  . Drug use: Never  . Sexual activity: Yes    Comment: married 3.5 years  Other Topics Concern  . Not on file  Social History Narrative   Lives with significant other; has 2 children who live a couple of hours away   Social Determinants of Health   Financial Resource Strain: Low Risk   . Difficulty of Paying Living Expenses: Not hard at all  Food Insecurity: No Food Insecurity  . Worried About Charity fundraiser in the Last Year: Never true   . Ran Out of Food in the Last Year: Never true  Transportation Needs: No Transportation Needs  . Lack of Transportation (Medical): No  . Lack of Transportation (Non-Medical): No  Physical Activity: Insufficiently Active  . Days of Exercise per Week: 7 days  . Minutes of Exercise per Session: 20 min  Stress: No Stress Concern Present  . Feeling of Stress : Only a little  Social Connections: Moderately Isolated  . Frequency of Communication with Friends and Family: More than three times a week  . Frequency of Social Gatherings with Friends and Family: Once a week  . Attends Religious Services: Never  . Active Member of Clubs or Organizations: No  . Attends Archivist Meetings: Never  . Marital Status: Living with partner    Tobacco Counseling Counseling given: Not Answered Comment: e cigarrettes now and coming down off of them   Clinical Intake:  Pre-visit preparation completed: Yes  Pain : No/denies pain     BMI - recorded: 34.01 Nutritional Status: BMI > 30  Obese Nutritional Risks: None Diabetes: Yes CBG done?: No Did pt. bring in CBG monitor from home?: No  How often do you need to have someone help you when you read instructions, pamphlets, or other written materials from your doctor or pharmacy?: 1 - Never  Nutrition Risk Assessment:  Has the patient had any N/V/D within the last 2 months?  No  Does the patient have any non-healing wounds?  No  Has the patient had any unintentional weight loss or weight gain?  No   Diabetes:  Is the patient diabetic?  Yes  If diabetic, was a CBG obtained today?  No  Did the patient bring in their glucometer from home?  No  How often do you monitor your CBG's? He doesn't check sugar at home.   Financial Strains and Diabetes Management:  Are you having any financial strains with the device, your supplies or your medication? No .  Does the patient want to be seen by Chronic Care Management for management of their  diabetes?  No  Would the patient like to be referred to a Nutritionist or for Diabetic Management?  No   Diabetic Exams:  Diabetic Eye Exam: Overdue for diabetic eye exam. Pt has been advised about the importance in completing this exam. I referred patient verbally to Dr Marin Comment in [place] - He will call her or stop by soon.  Diabetic Foot Exam: Completed 12/19/2020. Pt has been advised about the importance in completing this exam. Pt is scheduled for diabetic foot exam on 03/2021.    Interpreter Needed?: No  Information entered by :: Marylen Zuk, LPN   Activities of Daily Living In your present state of health, do you have any difficulty performing the following activities: 01/28/2021  Hearing? N  Vision? N  Difficulty concentrating or making decisions?  N  Walking or climbing stairs? N  Dressing or bathing? N  Doing errands, shopping? N  Preparing Food and eating ? N  Using the Toilet? N  In the past six months, have you accidently leaked urine? N  Do you have problems with loss of bowel control? N  Managing your Medications? N  Managing your Finances? N  Housekeeping or managing your Housekeeping? N  Some recent data might be hidden    Patient Care Team: Dettinger, Elige Radon, MD as PCP - General (Family Medicine)  Indicate any recent Medical Services you may have received from other than [facility] providers in the past year (date may be approximate).     Assessment:   This is a routine wellness examination for Dymond.  Hearing/Vision screen  Hearing Screening   125Hz  250Hz  500Hz  1000Hz  2000Hz  3000Hz  4000Hz  6000Hz  8000Hz   Right ear:           Left ear:           Comments: Declines hearing difficulties  Vision Screening Comments: Wears eyeglasses - Doesn't have a regular eye doctor  Dietary issues and exercise activities discussed: Current Exercise Habits: Home exercise routine, Type of exercise: walking, Time (Minutes): 20, Frequency (Times/Week): 7, Weekly Exercise  (Minutes/Week): 140, Intensity: Mild, Exercise limited by: None identified  Goals    . 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 0 0 0 0 0  Number falls in past yr: 0 - - - -  Injury with Fall? 0 - - - -  Risk for fall due to : No Fall Risks - - - -  Follow up Falls prevention discussed - - - -    FALL RISK PREVENTION PERTAINING TO THE HOME:  Any stairs in or around the home? Yes  If so, are there any without handrails? No  Home free of loose throw rugs in walkways, pet beds, electrical cords, etc? Yes  Adequate lighting in your home to reduce risk of falls? Yes   ASSISTIVE DEVICES UTILIZED TO PREVENT FALLS:  Life alert? No  Use of a cane, walker or w/c? No  Grab bars in the bathroom? Yes  Shower chair or bench in shower? Yes  Elevated toilet seat or a handicapped toilet? Yes   TIMED UP AND GO:  Was the test performed? No . Telephonic visit  Cognitive Function: Normal cognitive status assessed by direct observation by this Nurse Health Advisor. No abnormalities found.   MMSE - Mini Mental State Exam 12/09/2018  Orientation to time 5  Orientation to Place 4  Registration 3  Attention/ Calculation 5  Recall 1  Language- name 2 objects 2  Language- repeat 1  Language- follow 3 step command 1  Language- read & follow direction 1  Write a sentence 1  Copy  design 1  Total score 25        Immunizations Immunization History  Administered Date(s) Administered  . Fluad Quad(high Dose 65+) 06/25/2020, 09/17/2020  . Influenza, High Dose Seasonal PF 10/22/2018, 05/31/2019  . Moderna Sars-Covid-2 Vaccination 12/30/2019, 01/31/2020  . Pneumococcal Conjugate-13 12/19/2020    TDAP status: Up to date (He had this in [place] - we need dates)  Flu Vaccine status: Up to date  Pneumococcal vaccine status: Up to date  Covid-19 vaccine status: Completed vaccines  Qualifies for Shingles Vaccine? Yes   Zostavax completed No   Shingrix Completed?: No.    Education has been provided regarding the importance of this vaccine. Patient has been advised to call insurance company to determine out of pocket expense if they have not yet received this vaccine. Advised may also receive vaccine at local pharmacy or Health Dept. Verbalized acceptance and understanding.  Screening Tests Health Maintenance  Topic Date Due  . OPHTHALMOLOGY EXAM  03/21/2021 (Originally 10/11/1965)  . COVID-19 Vaccine (3 - Booster for Moderna series) 12/19/2021 (Originally 08/01/2020)  . TETANUS/TDAP  03/21/2022 (Originally 10/10/1974)  . INFLUENZA VACCINE  05/06/2021  . HEMOGLOBIN A1C  06/21/2021  . FOOT EXAM  12/19/2021  . PNA vac Low Risk Adult (2 of 2 - PPSV23) 12/19/2021  . Fecal DNA (Cologuard)  12/20/2021  . HPV VACCINES  Aged Out  . Hepatitis C Screening  Discontinued    Health Maintenance  There are no preventive care reminders to display for this patient.  Colorectal cancer screening: Type of screening: Cologuard. Completed 12/21/2018. Repeat every 3 years  Lung Cancer Screening: (Low Dose CT Chest recommended if Age 57-80 years, 30 pack-year currently smoking OR have quit w/in 15years.) does qualify.   Lung Cancer Screening Referral: [facility]/20[facility]  Additional Screening:  Hepatitis C Screening: does qualify; Needs this drawn at next lab appt  Vision  Screening: Recommended annual ophthalmology exams for early detection of glaucoma and other disorders of the eye. Is the patient up to date with their annual eye exam?  No  Who is the provider or what is the name of the office in which the patient attends annual eye exams? N/A If pt is not established with a provider, would they like to be referred to a provider to establish care? No .   Dental Screening: Recommended annual dental exams for proper oral hygiene  Community Resource Referral / Chronic Care Management: CRR required this visit?  No   CCM required this visit?  No      Plan:     I have personally reviewed and noted the following in the patient's chart:   . Medical and social history . Use of alcohol, tobacco or illicit drugs  . Current medications and supplements . Functional ability and status . Nutritional status . Physical activity . Advanced directives . List of other physicians . Hospitalizations, surgeries, and ER visits in previous 12 months . Vitals . Screenings to include cognitive, depression, and falls . Referrals and appointments  In addition, I have reviewed and discussed with patient certain preventive protocols, quality metrics, and best practice recommendations. A written personalized care plan for preventive services as well as general preventive health recommendations were provided to patient.     Sandrea Hammond, LPN   [id number]   Nurse Notes: None

## 2021-02-26 ENCOUNTER — Encounter (HOSPITAL_COMMUNITY): Payer: Self-pay

## 2021-02-26 ENCOUNTER — Other Ambulatory Visit (HOSPITAL_COMMUNITY): Payer: Self-pay

## 2021-02-26 DIAGNOSIS — Z87891 Personal history of nicotine dependence: Secondary | ICD-10-CM

## 2021-02-26 DIAGNOSIS — Z122 Encounter for screening for malignant neoplasm of respiratory organs: Secondary | ICD-10-CM

## 2021-02-26 NOTE — Progress Notes (Signed)
Received referral for initial lung cancer screening scan. Contacted patient and obtained smoking history (started age 65 smoking 1PPD until the age of 26, former smoker who quit smoking 3 years ago, 19 pack year) as well as answering questions related to the screening process.  Patient denies signs/symptoms of lung cancer such as weight loss or hemoptysis. Patient denies comorbidity that would prevent curative treatment if lung cancer were to be found.  Patient to be scheduled for [facility] and LDCT

## 2021-03-12 ENCOUNTER — Other Ambulatory Visit: Payer: Self-pay | Admitting: Family Medicine

## 2021-03-12 DIAGNOSIS — I10 Essential (primary) hypertension: Secondary | ICD-10-CM

## 2021-03-20 NOTE — Progress Notes (Signed)
David Tate  Visit Date: 03/21/21  Visit Type: In-Person at [place] DECISION-MAKING VISIT - Age: 65 y.o.  - Pack year smoking history: 50 pack-years (1 PPD x 50 years) - Type of tobacco abuse: Cigarettes - Current smoker or < 15 years of cessation: Quit 3 years ago - No current symptoms of lung cancer:   Patient denies any hemoptysis, unintentional weight loss, and unexplained cough - Risks and benefits of lung cancer screening discussed: Negative: Over-diagnosis, radiation exposure, false positives, and additional testing Positive: Discover early stage lung cancer resulting in higher incidence of cure - Patient educated regarding the importance of adherence to continued lung cancer screening. - Currently, there are no co-morbidities to prevent treatment to therapy for lung cancer and the patient is agreeable to pursue treatment if a malignancy is discovered.  [place] Preventative Services Task Force recommend annual screening for lung cancer with low-dose CT in adults aged 49 - 62 years who have a 20+ pack year smoking history and currently smoke or have quit smoking within the past 15 years.  Screening should be discontinued once a person has not smoked for 15 years or develops a health problem that substantially limits life expectancy or the ability or willingness to have curative lung surgery.  It is a category B recommendation.  Similar stances are provided by CMS, NCCN, and AATS.  Social History   Tobacco Use   Smoking status: Former    Packs/day: 1.00    Years: 50.00    Pack years: 50.00    Types: Cigarettes    Quit date: 01/2017    Years since quitting: 4.2   Smokeless tobacco: Never   Tobacco comments:    e cigarrettes now and coming down off of them  Vaping Use   Vaping Use: Every day  Substance Use Topics   Alcohol use: Yes    Alcohol/week: 2.0 standard drinks    Types: 2 Standard drinks or equivalent per week    Comment: occ -  once per month   Drug use: Never     Personal history of tobacco use presenting hazards to health: - This patient meets criteria for low-dose CT lung cancer screening  - The shared decision making visit discussion included risks and benefits of screening, potential for follow-up, diagnostic testing for abnormal scans, potential for false positive tests, overdiagnosis, discussion about total radiation exposure - Patient stated willingness to undergo diagnostics and treatment as needed - Patient was counseled on smoking cessation to decrease the  risk of lung cancer, pulmonary disease, heart disease, and stroke - Patient has been referred to Lung Cancer Screening Nurse Coordinator for further scheduling of LDCT and for further resources regarding free nicotine replacement therapy and information about smoking cessation classes - Counseling on the importance of adherence to annual lung cancer LDCT screening, impact of co-morbidities, and ability or willingness to undergo diagnosis and treatment is imperative for compliance of the program. - Counseling on the importance of continued smoking cessation for former smokers; the importance of smoking cessation for current smokers, and information about tobacco cessation interventions have been given to patient including [name] and 1-800-quit [name] programs.  Yearly follow up will be coordinated by David Huguenin, RN, MSN [facility] Nurse Navigator.)  David Rush, PA-C  03/21/21 3:45 PM

## 2021-03-21 ENCOUNTER — Other Ambulatory Visit: Payer: Self-pay

## 2021-03-21 ENCOUNTER — Ambulatory Visit (HOSPITAL_COMMUNITY)
Admission: RE | Admit: 2021-03-21 | Discharge: 2021-03-21 | Disposition: A | Discharge status: Home / Self Care | Payer: Medicare Other | Source: Ambulatory Visit | Attending: Oncology | Admitting: Oncology

## 2021-03-21 ENCOUNTER — Inpatient Hospital Stay (HOSPITAL_COMMUNITY): Payer: Medicare Other | Attending: Physician Assistant | Admitting: Physician Assistant

## 2021-03-21 DIAGNOSIS — Z87891 Personal history of nicotine dependence: Secondary | ICD-10-CM | POA: Diagnosis not present

## 2021-03-21 DIAGNOSIS — Z122 Encounter for screening for malignant neoplasm of respiratory organs: Secondary | ICD-10-CM | POA: Diagnosis not present

## 2021-03-21 DIAGNOSIS — F17210 Nicotine dependence, cigarettes, uncomplicated: Secondary | ICD-10-CM | POA: Diagnosis not present

## 2021-03-21 NOTE — Patient Instructions (Signed)
You were seen today for a Lung Cancer Screening shared decision-making visit and low-dose CT scan.

## 2021-03-22 ENCOUNTER — Ambulatory Visit (INDEPENDENT_AMBULATORY_CARE_PROVIDER_SITE_OTHER): Payer: Medicare Other | Admitting: Family Medicine

## 2021-03-22 ENCOUNTER — Other Ambulatory Visit: Payer: Self-pay

## 2021-03-22 ENCOUNTER — Encounter: Payer: Self-pay | Admitting: Family Medicine

## 2021-03-22 VITALS — BP 120/61 | HR 52 | Ht 70.0 in | Wt 237.0 lb

## 2021-03-22 DIAGNOSIS — I10 Essential (primary) hypertension: Secondary | ICD-10-CM

## 2021-03-22 DIAGNOSIS — E1169 Type 2 diabetes mellitus with other specified complication: Secondary | ICD-10-CM | POA: Diagnosis not present

## 2021-03-22 DIAGNOSIS — E785 Hyperlipidemia, unspecified: Secondary | ICD-10-CM

## 2021-03-22 LAB — CBC WITH DIFFERENTIAL/PLATELET
Basophils Absolute: 0.1 10*3/uL (ref 0.0–0.2)
Basos: 1 %
EOS (ABSOLUTE): 0.3 10*3/uL (ref 0.0–0.4)
Eos: 2 %
Hematocrit: 46.9 % (ref 37.5–51.0)
Hemoglobin: 16 g/dL (ref 13.0–17.7)
Immature Grans (Abs): 0.2 10*3/uL — ABNORMAL HIGH (ref 0.0–0.1)
Immature Granulocytes: 1 %
Lymphocytes Absolute: 2.9 10*3/uL (ref 0.7–3.1)
Lymphs: 24 %
MCH: 28.9 pg (ref 26.6–33.0)
MCHC: 34.1 g/dL (ref 31.5–35.7)
MCV: 85 fL (ref 79–97)
Monocytes Absolute: 0.8 10*3/uL (ref 0.1–0.9)
Monocytes: 7 %
Neutrophils Absolute: 7.6 10*3/uL — ABNORMAL HIGH (ref 1.4–7.0)
Neutrophils: 65 %
Platelets: 215 10*3/uL (ref 150–450)
RBC: 5.54 x10E6/uL (ref 4.14–5.80)
RDW: 12.4 % (ref 11.6–15.4)
WBC: 11.8 10*3/uL — ABNORMAL HIGH (ref 3.4–10.8)

## 2021-03-22 LAB — CMP14+EGFR
ALT: 18 IU/L (ref 0–44)
AST: 17 IU/L (ref 0–40)
Albumin/Globulin Ratio: 1.7 (ref 1.2–2.2)
Albumin: 4.4 g/dL (ref 3.8–4.8)
Alkaline Phosphatase: 109 IU/L (ref 44–121)
BUN/Creatinine Ratio: 18 (ref 10–24)
BUN: 23 mg/dL (ref 8–27)
Bilirubin Total: 0.4 mg/dL (ref 0.0–1.2)
CO2: 23 mmol/L (ref 20–29)
Calcium: 9.7 mg/dL (ref 8.6–10.2)
Chloride: 102 mmol/L (ref 96–106)
Creatinine, Ser: 1.28 mg/dL — ABNORMAL HIGH (ref 0.76–1.27)
Globulin, Total: 2.6 g/dL (ref 1.5–4.5)
Glucose: 148 mg/dL — ABNORMAL HIGH (ref 65–99)
Potassium: 4.4 mmol/L (ref 3.5–5.2)
Sodium: 141 mmol/L (ref 134–144)
Total Protein: 7 g/dL (ref 6.0–8.5)
eGFR: 61 mL/min/{1.73_m2} (ref >59)

## 2021-03-22 LAB — LIPID PANEL
Chol/HDL Ratio: 4.8 ratio (ref 0.0–5.0)
Cholesterol, Total: 152 mg/dL (ref 100–199)
HDL: 32 mg/dL — ABNORMAL LOW (ref >39)
LDL Chol Calc (NIH): 95 mg/dL (ref 0–99)
Triglycerides: 142 mg/dL (ref 0–149)
VLDL Cholesterol Cal: 25 mg/dL (ref 5–40)

## 2021-03-22 LAB — BAYER DCA HB A1C WAIVED: HB A1C (BAYER DCA - WAIVED): 6.6 % (ref <7.0)

## 2021-03-22 MED ORDER — EMPAGLIFLOZIN 10 MG PO TABS: 10.0000 mg | ORAL_TABLET | Freq: Every day | ORAL | 3 refills | Status: DC

## 2021-03-22 MED ORDER — LOSARTAN POTASSIUM 50 MG PO TABS: 2.0000 | ORAL_TABLET | Freq: Every day | ORAL | 3 refills | Status: DC

## 2021-03-22 MED ORDER — METFORMIN HCL ER 500 MG PO TB24: 1000.0000 mg | ORAL_TABLET | Freq: Two times a day (BID) | ORAL | 3 refills | Status: DC

## 2021-03-22 NOTE — Progress Notes (Signed)
BP 120/61   Pulse (!) 52   Ht 5' 10"  (1.778 m)   Wt 237 lb (107.5 kg)   SpO2 94%   BMI 34.01 kg/m    Subjective:   Patient ID: David Tate, male    DOB: 30-Jan-1956, 65 y.o.   MRN: [id number]  HPI: David Tate is a 65 y.o. male presenting on 03/22/2021 for Medical Management of Chronic Issues and Diabetes   HPI Type 2 diabetes mellitus Patient comes in today for recheck of his diabetes. Patient has been currently taking Jardiance and metformin, A1c is good at 6.6. Patient is currently on an ACE inhibitor/ARB. Patient has not seen an ophthalmologist this year. Patient denies any issues with their feet. The symptom started onset as an adult hypertension and hyperlipidemia ARE RELATED TO DM   Hypertension Patient is currently on losartan, and their blood pressure today is 120/60. Patient denies any lightheadedness or dizziness. Patient denies headaches, blurred vision, chest pains, shortness of breath, or weakness. Denies any side effects from medication and is content with current medication.   Hyperlipidemia Patient is coming in for recheck of his hyperlipidemia. The patient is currently taking pravastatin. They deny any issues with myalgias or history of liver damage from it. They deny any focal numbness or weakness or chest pain.   Relevant past medical, surgical, family and social history reviewed and updated as indicated. Interim medical history since our last visit reviewed. Allergies and medications reviewed and updated.  Review of Systems  Constitutional:  Negative for chills and fever.  Eyes:  Negative for visual disturbance.  Respiratory:  Negative for shortness of breath and wheezing.   Cardiovascular:  Negative for chest pain and leg swelling.  Musculoskeletal:  Negative for back pain and gait problem.  Skin:  Negative for rash.  Neurological:  Negative for dizziness, weakness and numbness.  All other systems reviewed and are negative.  Per HPI unless specifically  indicated above   Allergies as of 03/22/2021       Reactions   Lisinopril Cough        Medication List        Accurate as of March 22, 2021 11:28 AM. If you have any questions, ask your nurse or doctor.          empagliflozin 10 MG Tabs tablet Commonly known as: Jardiance Take 1 tablet (10 mg total) by mouth daily before breakfast.   losartan 50 MG tablet Commonly known as: COZAAR Take 2 tablets (100 mg total) by mouth daily.   metFORMIN 500 MG 24 hr tablet Commonly known as: GLUCOPHAGE-XR Take 2 tablets (1,000 mg total) by mouth 2 (two) times daily.   pravastatin 20 MG tablet Commonly known as: PRAVACHOL Take 1 tablet (20 mg total) by mouth daily.   Rivaroxaban 15 MG Tabs tablet Commonly known as: XARELTO Take 1 tablet (15 mg total) by mouth daily with supper.   sertraline 50 MG tablet Commonly known as: ZOLOFT Take 1 tablet (50 mg total) by mouth daily.   sildenafil 20 MG tablet Commonly known as: REVATIO Take 1-3 tablets (20-60 mg total) by mouth as needed.         Objective:   BP 120/61   Pulse (!) 52   Ht 5' 10"  (1.778 m)   Wt 237 lb (107.5 kg)   SpO2 94%   BMI 34.01 kg/m   Wt Readings from Last 3 Encounters:  03/22/21 237 lb (107.5 kg)  01/28/21 237 lb (107.5 kg)  12/19/20  244 lb (110.7 kg)    Physical Exam Vitals and nursing note reviewed.  Constitutional:      General: He is not in acute distress.    Appearance: He is well-developed. He is not diaphoretic.  Eyes:     General: No scleral icterus.    Conjunctiva/sclera: Conjunctivae normal.  Neck:     Thyroid: No thyromegaly.  Cardiovascular:     Rate and Rhythm: Normal rate and regular rhythm.     Heart sounds: Normal heart sounds. No murmur heard. Pulmonary:     Effort: Pulmonary effort is normal. No respiratory distress.     Breath sounds: Normal breath sounds. No wheezing.  Musculoskeletal:        General: Normal range of motion.     Cervical back: Neck supple.   Lymphadenopathy:     Cervical: No cervical adenopathy.  Skin:    General: Skin is warm and dry.     Findings: No rash.  Neurological:     Mental Status: He is alert and oriented to person, place, and time.     Coordination: Coordination normal.  Psychiatric:        Behavior: Behavior normal.    Results for orders placed or performed in visit on 12/19/20  Bayer DCA Hb A1c Waived  Result Value Ref Range   HB A1C (BAYER DCA - WAIVED) 6.7 <7.0 %    Assessment & Plan:   Problem List Items Addressed This Visit       Cardiovascular and Mediastinum   Hypertension   Relevant Medications   losartan (COZAAR) 50 MG tablet   Other Relevant Orders   CBC with Differential/Platelet   CMP14+EGFR   Lipid panel   Bayer DCA Hb A1c Waived     Endocrine   Type 2 diabetes mellitus with other specified complication (HCC) - Primary   Relevant Medications   empagliflozin (JARDIANCE) 10 MG TABS tablet   losartan (COZAAR) 50 MG tablet   metFORMIN (GLUCOPHAGE-XR) 500 MG 24 hr tablet   Other Relevant Orders   CBC with Differential/Platelet   CMP14+EGFR   Lipid panel   Bayer DCA Hb A1c Waived   Hyperlipidemia associated with type 2 diabetes mellitus (HCC)   Relevant Medications   empagliflozin (JARDIANCE) 10 MG TABS tablet   losartan (COZAAR) 50 MG tablet   metFORMIN (GLUCOPHAGE-XR) 500 MG 24 hr tablet   Other Relevant Orders   CBC with Differential/Platelet   CMP14+EGFR   Lipid panel   Bayer DCA Hb A1c Waived   Other Visit Diagnoses     Essential hypertension       Relevant Medications   losartan (COZAAR) 50 MG tablet       Continue current medication, A1c looks good.  No change Follow up plan: Return in about 3 months (around 06/22/2021), or if symptoms worsen or fail to improve, for dm and htn hld.  Counseling provided for all of the vaccine components Orders Placed This Encounter  Procedures   CBC with Differential/Platelet   CMP14+EGFR   Lipid panel   Bayer DCA Hb A1c  Waived    Caryl Pina, MD [facility] 03/22/2021, 11:28 AM

## 2021-03-26 ENCOUNTER — Encounter (HOSPITAL_COMMUNITY): Payer: Self-pay

## 2021-03-26 NOTE — Progress Notes (Signed)
Patient notified of LDCT Lung Cancer Screening Results via mail with the recommendation to follow-up in 12 months. Patient's referring provider has been sent a copy of results. Results are as follows:  IMPRESSION: 1. Lung-RADS 2, benign appearance or behavior. Continue annual screening with low-dose chest CT without contrast in 12 months. 2. Coronary artery calcifications noted.   Aortic Atherosclerosis (ICD10-I70.0) and Emphysema (ICD10-J43.9).

## 2021-04-01 NOTE — Progress Notes (Signed)
NA no VM 6/27-jhb

## 2021-06-24 ENCOUNTER — Encounter: Payer: Self-pay | Admitting: Family Medicine

## 2021-06-24 ENCOUNTER — Other Ambulatory Visit: Payer: Self-pay

## 2021-06-24 ENCOUNTER — Ambulatory Visit (INDEPENDENT_AMBULATORY_CARE_PROVIDER_SITE_OTHER): Payer: Medicare Other | Admitting: Family Medicine

## 2021-06-24 VITALS — BP 110/56 | HR 60 | Ht 70.0 in | Wt 236.0 lb

## 2021-06-24 DIAGNOSIS — I10 Essential (primary) hypertension: Secondary | ICD-10-CM

## 2021-06-24 DIAGNOSIS — Z86718 Personal history of other venous thrombosis and embolism: Secondary | ICD-10-CM

## 2021-06-24 DIAGNOSIS — Z23 Encounter for immunization: Secondary | ICD-10-CM

## 2021-06-24 DIAGNOSIS — E1169 Type 2 diabetes mellitus with other specified complication: Secondary | ICD-10-CM | POA: Diagnosis not present

## 2021-06-24 DIAGNOSIS — E785 Hyperlipidemia, unspecified: Secondary | ICD-10-CM

## 2021-06-24 DIAGNOSIS — F410 Panic disorder [episodic paroxysmal anxiety] without agoraphobia: Secondary | ICD-10-CM

## 2021-06-24 LAB — BAYER DCA HB A1C WAIVED: HB A1C (BAYER DCA - WAIVED): 6 % — ABNORMAL HIGH (ref 4.8–5.6)

## 2021-06-24 MED ORDER — SERTRALINE HCL 50 MG PO TABS: 50.0000 mg | ORAL_TABLET | Freq: Every day | ORAL | 3 refills | Status: DC

## 2021-06-24 MED ORDER — PRAVASTATIN SODIUM 20 MG PO TABS: 20.0000 mg | ORAL_TABLET | Freq: Every day | ORAL | 3 refills | Status: DC

## 2021-06-24 MED ORDER — RIVAROXABAN 15 MG PO TABS: 15.0000 mg | ORAL_TABLET | Freq: Every day | ORAL | 3 refills | Status: DC

## 2021-06-24 NOTE — Progress Notes (Signed)
BP (!) 110/56   Pulse 60   Ht '5\' 10"'$  (1.778 m)   Wt 236 lb (107 kg)   SpO2 95%   BMI 33.86 kg/m    Subjective:   Patient ID: David Tate, male    DOB: 23-Mar-1956, 65 y.o.   MRN: [id number]  HPI: David Tate is a 65 y.o. male presenting on 06/24/2021 for Medical Management of Chronic Issues and Diabetes   HPI Type 2 diabetes mellitus Patient comes in today for recheck of his diabetes. Patient has been currently taking metformin and Jardiance, A1c 6.0 looks good.. Patient is currently on an ACE inhibitor/ARB. Patient has not seen an ophthalmologist this year. Patient denies any issues with their feet. The symptom started onset as an adult hypertension and hyperlipidemia ARE RELATED TO DM   Hypertension Patient is currently on losartan, and their blood pressure today is 110/56. Patient denies any lightheadedness or dizziness. Patient denies headaches, blurred vision, chest pains, shortness of breath, or weakness. Denies any side effects from medication and is content with current medication.   Hyperlipidemia Patient is coming in for recheck of his hyperlipidemia. The patient is currently taking pravastatin. They deny any issues with myalgias or history of liver damage from it. They deny any focal numbness or weakness or chest pain.   History of DVT Patient is on Xarelto for history of DVT.  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  Denies any bruising or bleeding.  Relevant past medical, surgical, family and social history reviewed and updated as indicated. Interim medical history since our last visit reviewed. Allergies and medications reviewed and updated.  Review of Systems  Constitutional:  Negative for chills and fever.  Eyes:  Negative for visual disturbance.  Respiratory:  Negative for shortness of breath and wheezing.   Cardiovascular:  Negative for chest pain and leg swelling.  Musculoskeletal:  Negative for back pain and gait  problem.  Skin:  Negative for rash.  Neurological:  Negative for dizziness, weakness and light-headedness.  All other systems reviewed and are negative.  Per HPI unless specifically indicated above   Allergies as of 06/24/2021       Reactions   Lisinopril Cough        Medication List        Accurate as of June 24, 2021 10:58 AM. If you have any questions, ask your nurse or doctor.          empagliflozin 10 MG Tabs tablet Commonly known as: Jardiance Take 1 tablet (10 mg total) by mouth daily before breakfast.   losartan 50 MG tablet Commonly known as: COZAAR Take 2 tablets (100 mg total) by mouth daily.   metFORMIN 500 MG 24 hr tablet Commonly known as: GLUCOPHAGE-XR Take 2 tablets (1,000 mg total) by mouth 2 (two) times daily.   pravastatin 20 MG tablet Commonly known as: PRAVACHOL Take 1 tablet (20 mg total) by mouth daily.   Rivaroxaban 15 MG Tabs tablet Commonly known as: XARELTO Take 1 tablet (15 mg total) by mouth daily with supper.   sertraline 50 MG tablet Commonly known as: ZOLOFT Take 1 tablet (50 mg total) by mouth daily.   sildenafil 20 MG tablet Commonly known as: REVATIO Take 1-3 tablets (20-60 mg total) by mouth as needed.         Objective:   BP (!) 110/56   Pulse 60   Ht '5\' 10"'$  (1.778 m)  Wt 236 lb (107 kg)   SpO2 95%   BMI 33.86 kg/m   Wt Readings from Last 3 Encounters:  06/24/21 236 lb (107 kg)  03/22/21 237 lb (107.5 kg)  01/28/21 237 lb (107.5 kg)    Physical Exam Vitals and nursing note reviewed.  Constitutional:      General: He is not in acute distress.    Appearance: He is well-developed. He is not diaphoretic.  Eyes:     General: No scleral icterus.    Conjunctiva/sclera: Conjunctivae normal.  Neck:     Thyroid: No thyromegaly.  Cardiovascular:     Rate and Rhythm: Normal rate and regular rhythm.     Heart sounds: Normal heart sounds. No murmur heard. Pulmonary:     Effort: Pulmonary effort is  normal. No respiratory distress.     Breath sounds: Normal breath sounds. No wheezing.  Musculoskeletal:        General: No swelling. Normal range of motion.     Cervical back: Neck supple.  Lymphadenopathy:     Cervical: No cervical adenopathy.  Skin:    General: Skin is warm and dry.     Findings: No rash.  Neurological:     Mental Status: He is alert and oriented to person, place, and time.     Coordination: Coordination normal.  Psychiatric:        Behavior: Behavior normal.      Assessment & Plan:   Problem List Items Addressed This Visit       Cardiovascular and Mediastinum   Hypertension   Relevant Medications   Rivaroxaban (XARELTO) 15 MG TABS tablet   pravastatin (PRAVACHOL) 20 MG tablet     Endocrine   Type 2 diabetes mellitus with other specified complication (HCC) - Primary   Relevant Medications   pravastatin (PRAVACHOL) 20 MG tablet   Other Relevant Orders   Bayer DCA Hb A1c Waived   Varicella-zoster vaccine IM (Shingrix)   Hyperlipidemia associated with type 2 diabetes mellitus (HCC)   Relevant Medications   pravastatin (PRAVACHOL) 20 MG tablet     Other   History of DVT (deep vein thrombosis)   Relevant Medications   Rivaroxaban (XARELTO) 15 MG TABS tablet   Other Visit Diagnoses     Need for pneumococcal vaccination       Relevant Orders   Pneumococcal conjugate vaccine 20-valent (Prevnar 20)   Anxiety attack       Relevant Medications   sertraline (ZOLOFT) 50 MG tablet       A1c looks good, continue current medicine.  It was 6.0.  No changes.    Follow up plan: Return in about 3 months (around 09/23/2021), or if symptoms worsen or fail to improve, for Diabetes and hypertension and cholesterol.  Counseling provided for all of the vaccine components Orders Placed This Encounter  Procedures   Varicella-zoster vaccine IM (Shingrix)   Pneumococcal conjugate vaccine 20-valent (Prevnar 20)   Bayer DCA Hb A1c Waived    Caryl Pina,  MD [facility] 06/24/2021, 10:58 AM

## 2021-09-26 ENCOUNTER — Encounter: Payer: Self-pay | Admitting: Family Medicine

## 2021-09-26 ENCOUNTER — Ambulatory Visit (INDEPENDENT_AMBULATORY_CARE_PROVIDER_SITE_OTHER): Payer: Medicare Other | Admitting: Family Medicine

## 2021-09-26 VITALS — BP 134/77 | HR 69 | Ht 70.0 in | Wt 245.0 lb

## 2021-09-26 DIAGNOSIS — E785 Hyperlipidemia, unspecified: Secondary | ICD-10-CM

## 2021-09-26 DIAGNOSIS — E1169 Type 2 diabetes mellitus with other specified complication: Secondary | ICD-10-CM

## 2021-09-26 DIAGNOSIS — I10 Essential (primary) hypertension: Secondary | ICD-10-CM | POA: Diagnosis not present

## 2021-09-26 LAB — BAYER DCA HB A1C WAIVED: HB A1C (BAYER DCA - WAIVED): 6.7 % — ABNORMAL HIGH (ref 4.8–5.6)

## 2021-09-26 NOTE — Progress Notes (Signed)
BP 134/77    Pulse 69    Ht _0  (1.778 m)    Wt 245 lb (111.1 kg)    SpO2 94%    BMI 35.15 kg/m    Subjective:   Patient ID: David Tate, male    DOB: 10-11-1955, 65 y.o.   MRN: [id number]  HPI: David Tate is a 65 y.o. male presenting on 09/26/2021 for Medical Management of Chronic Issues, Diabetes, Hyperlipidemia, and Hypertension   HPI Type 2 diabetes mellitus Patient comes in today for recheck of his diabetes. Patient has been currently taking metformin and Jardiance, A1c is 6.7 and looks good. Patient is currently on an ACE inhibitor/ARB. Patient has seen an ophthalmologist this year. Patient denies any issues with their feet. The symptom started onset as an adult hypertension and hyperlipidemia ARE RELATED TO DM   Hypertension Patient is currently on losartan, and their blood pressure today is 134/77. Patient denies any lightheadedness or dizziness. Patient denies headaches, blurred vision, chest pains, shortness of breath, or weakness. Denies any side effects from medication and is content with current medication.   Hyperlipidemia Patient is coming in for recheck of his hyperlipidemia. The patient is currently taking pravastatin. They deny any issues with myalgias or history of liver damage from it. They deny any focal numbness or weakness or chest pain.   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  Relevant past medical, surgical, family and social history reviewed and updated as indicated. Interim medical history since our last visit reviewed. Allergies and medications reviewed and updated.  Review of Systems  Constitutional:  Negative for chills and fever.  Eyes:  Negative for visual disturbance.  Respiratory:  Negative for shortness of breath and wheezing.   Cardiovascular:  Negative for chest pain and leg swelling.  Musculoskeletal:  Negative for back pain and gait problem.  Skin:  Negative for  rash.  Neurological:  Negative for dizziness, weakness and light-headedness.  Psychiatric/Behavioral:  Positive for dysphoric mood (Son recently diagnosed with stage IV colon cancer).   All other systems reviewed and are negative.  Per HPI unless specifically indicated above   Allergies as of 09/26/2021       Reactions   Lisinopril Cough        Medication List        Accurate as of September 26, 2021 11:21 AM. If you have any questions, ask your nurse or doctor.          empagliflozin 10 MG Tabs tablet Commonly known as: Jardiance Take 1 tablet (10 mg total) by mouth daily before breakfast.   losartan 50 MG tablet Commonly known as: COZAAR Take 2 tablets (100 mg total) by mouth daily.   metFORMIN 500 MG tablet Commonly known as: GLUCOPHAGE Take 1,000 mg by mouth daily. What changed: Another medication with the same name was removed. Continue taking this medication, and follow the directions you see here. Changed by: Fransisca Kaufmann Shiven Junious, MD   pravastatin 20 MG tablet Commonly known as: PRAVACHOL Take 1 tablet (20 mg total) by mouth daily.   Rivaroxaban 15 MG Tabs tablet Commonly known as: XARELTO Take 1 tablet (15 mg total) by mouth daily with supper.   sertraline 50 MG tablet Commonly known as: ZOLOFT Take 1 tablet (50 mg total) by mouth daily.   sildenafil 20 MG tablet Commonly known as: REVATIO Take 1-3 tablets (20-60 mg total) by mouth as  needed.         Objective:   BP 134/77    Pulse 69    Ht _0  (1.778 m)    Wt 245 lb (111.1 kg)    SpO2 94%    BMI 35.15 kg/m   Wt Readings from Last 3 Encounters:  09/26/21 245 lb (111.1 kg)  06/24/21 236 lb (107 kg)  03/22/21 237 lb (107.5 kg)    Physical Exam Vitals and nursing note reviewed.  Constitutional:      General: He is not in acute distress.    Appearance: He is well-developed. He is not diaphoretic.  Eyes:     General: No scleral icterus.    Conjunctiva/sclera: Conjunctivae normal.  Neck:      Thyroid: No thyromegaly.  Cardiovascular:     Rate and Rhythm: Normal rate and regular rhythm.     Heart sounds: Normal heart sounds. No murmur heard. Pulmonary:     Effort: Pulmonary effort is normal. No respiratory distress.     Breath sounds: Normal breath sounds. No wheezing.  Musculoskeletal:     Cervical back: Neck supple.  Lymphadenopathy:     Cervical: No cervical adenopathy.  Skin:    General: Skin is warm and dry.     Findings: No rash.  Neurological:     Mental Status: He is alert and oriented to person, place, and time.     Coordination: Coordination normal.  Psychiatric:        Behavior: Behavior normal.    A1c 6.7  Assessment & Plan:   Problem List Items Addressed This Visit       Cardiovascular and Mediastinum   Hypertension   Relevant Orders   CBC with Differential/Platelet   CMP14+EGFR   Lipid panel   Bayer DCA Hb A1c Waived     Endocrine   Type 2 diabetes mellitus with other specified complication (HCC) - Primary   Relevant Medications   metFORMIN (GLUCOPHAGE) 500 MG tablet   Other Relevant Orders   CBC with Differential/Platelet   CMP14+EGFR   Lipid panel   Bayer DCA Hb A1c Waived   Hyperlipidemia associated with type 2 diabetes mellitus (HCC)   Relevant Medications   metFORMIN (GLUCOPHAGE) 500 MG tablet   Other Relevant Orders   CBC with Differential/Platelet   CMP14+EGFR   Lipid panel   Bayer DCA Hb A1c Waived    A1c up slightly, likely due to stress and taking care of his son.  Recommend Just Focus on Diet at This Point Follow up plan: Return in about 3 months (around 12/25/2021), or if symptoms worsen or fail to improve, for Diabetes and cholesterol recheck.  Counseling provided for all of the vaccine components Orders Placed This Encounter  Procedures   CBC with Differential/Platelet   CMP14+EGFR   Lipid panel   Bayer DCA Hb A1c Waived    Caryl Pina, MD [facility] 09/26/2021, 11:21  AM

## 2021-09-27 LAB — CMP14+EGFR
ALT: 18 IU/L (ref 0–44)
AST: 20 IU/L (ref 0–40)
Albumin/Globulin Ratio: 1.7 (ref 1.2–2.2)
Albumin: 4.5 g/dL (ref 3.8–4.8)
Alkaline Phosphatase: 121 IU/L (ref 44–121)
BUN/Creatinine Ratio: 18 (ref 10–24)
BUN: 21 mg/dL (ref 8–27)
Bilirubin Total: 0.4 mg/dL (ref 0.0–1.2)
CO2: 26 mmol/L (ref 20–29)
Calcium: 9.4 mg/dL (ref 8.6–10.2)
Chloride: 103 mmol/L (ref 96–106)
Creatinine, Ser: 1.15 mg/dL (ref 0.76–1.27)
Globulin, Total: 2.6 g/dL (ref 1.5–4.5)
Glucose: 149 mg/dL — ABNORMAL HIGH (ref 70–99)
Potassium: 4.8 mmol/L (ref 3.5–5.2)
Sodium: 144 mmol/L (ref 134–144)
Total Protein: 7.1 g/dL (ref 6.0–8.5)
eGFR: 70 mL/min/{1.73_m2} (ref >59)

## 2021-09-27 LAB — CBC WITH DIFFERENTIAL/PLATELET
Basophils Absolute: 0.1 10*3/uL (ref 0.0–0.2)
Basos: 1 %
EOS (ABSOLUTE): 0.3 10*3/uL (ref 0.0–0.4)
Eos: 3 %
Hematocrit: 48.3 % (ref 37.5–51.0)
Hemoglobin: 16.4 g/dL (ref 13.0–17.7)
Immature Grans (Abs): 0.1 10*3/uL (ref 0.0–0.1)
Immature Granulocytes: 1 %
Lymphocytes Absolute: 2.8 10*3/uL (ref 0.7–3.1)
Lymphs: 28 %
MCH: 28.6 pg (ref 26.6–33.0)
MCHC: 34 g/dL (ref 31.5–35.7)
MCV: 84 fL (ref 79–97)
Monocytes Absolute: 0.7 10*3/uL (ref 0.1–0.9)
Monocytes: 7 %
Neutrophils Absolute: 6 10*3/uL (ref 1.4–7.0)
Neutrophils: 60 %
Platelets: 194 10*3/uL (ref 150–450)
RBC: 5.74 x10E6/uL (ref 4.14–5.80)
RDW: 11.9 % (ref 11.6–15.4)
WBC: 10 10*3/uL (ref 3.4–10.8)

## 2021-09-27 LAB — LIPID PANEL
Chol/HDL Ratio: 4.5 ratio (ref 0.0–5.0)
Cholesterol, Total: 181 mg/dL (ref 100–199)
HDL: 40 mg/dL (ref >39)
LDL Chol Calc (NIH): 109 mg/dL — ABNORMAL HIGH (ref 0–99)
Triglycerides: 180 mg/dL — ABNORMAL HIGH (ref 0–149)
VLDL Cholesterol Cal: 32 mg/dL (ref 5–40)

## 2021-12-09 ENCOUNTER — Ambulatory Visit (INDEPENDENT_AMBULATORY_CARE_PROVIDER_SITE_OTHER): Payer: Medicare Other | Admitting: Family Medicine

## 2021-12-09 ENCOUNTER — Encounter: Payer: Self-pay | Admitting: Family Medicine

## 2021-12-09 VITALS — BP 126/63 | HR 69 | Temp 98.4°F | Ht 70.0 in | Wt 246.1 lb

## 2021-12-09 DIAGNOSIS — H9313 Tinnitus, bilateral: Secondary | ICD-10-CM | POA: Diagnosis not present

## 2021-12-09 NOTE — Progress Notes (Signed)
? ?BP 126/63   Pulse 69   Temp 98.4 ?F (36.9 ?C) (Temporal)   Ht '5\' 10"'$  (1.778 m)   Wt 246 lb 2 oz (111.6 kg)   BMI 35.32 kg/m?   ? ?Subjective:  ? ?Patient ID: David Tate, male    DOB: [birth year], 66 y.o.   MRN: [id number] ? ?HPI: ?David Tate is a 66 y.o. male presenting on 12/09/2021 for Tinnitus ? ? ?HPI ?Ringing in ears ?Patient has been having ringing in his ears over the past 4 or 5 days.  He says it is both sides that he is having and then.  He denies any major hearing loss.  He was on a cruise just recently but denies any flying.  He does admit to having some sinus congestion and allergies especially with working on the house more often.  Patient denies any headaches but does have a little bit of sinus pressure and congestion going on. ? ?Relevant past medical, surgical, family and social history reviewed and updated as indicated. Interim medical history since our last visit reviewed. ?Allergies and medications reviewed and updated. ? ?Review of Systems  ?Constitutional:  Negative for chills and fever.  ?HENT:  Positive for congestion, sinus pain and tinnitus. Negative for ear discharge, ear pain, sneezing and sore throat.   ?Eyes:  Negative for visual disturbance.  ?Respiratory:  Negative for shortness of breath and wheezing.   ?Cardiovascular:  Negative for chest pain and leg swelling.  ?Musculoskeletal:  Negative for back pain and gait problem.  ?Skin:  Negative for rash.  ?All other systems reviewed and are negative. ? ?Per HPI unless specifically indicated above ? ? ?Allergies as of 12/09/2021   ? ?   Reactions  ? Lisinopril Cough  ? ?  ? ?  ?Medication List  ?  ? ?  ? Accurate as of December 09, 2021 11:45 AM. If you have any questions, ask your nurse or doctor.  ?  ?  ? ?  ? ?empagliflozin 10 MG Tabs tablet ?Commonly known as: Jardiance ?Take 1 tablet (10 mg total) by mouth daily before breakfast. ?  ?losartan 50 MG tablet ?Commonly known as: COZAAR ?Take 2 tablets (100 mg total) by mouth daily. ?   ?metFORMIN 500 MG tablet ?Commonly known as: GLUCOPHAGE ?Take 1,000 mg by mouth daily. ?  ?pravastatin 20 MG tablet ?Commonly known as: PRAVACHOL ?Take 1 tablet (20 mg total) by mouth daily. ?  ?Rivaroxaban 15 MG Tabs tablet ?Commonly known as: XARELTO ?Take 1 tablet (15 mg total) by mouth daily with supper. ?  ?sertraline 50 MG tablet ?Commonly known as: ZOLOFT ?Take 1 tablet (50 mg total) by mouth daily. ?  ?sildenafil 20 MG tablet ?Commonly known as: REVATIO ?Take 1-3 tablets (20-60 mg total) by mouth as needed. ?  ? ?  ? ? ? ?Objective:  ? ?BP 126/63   Pulse 69   Temp 98.4 ?F (36.9 ?C) (Temporal)   Ht '5\' 10"'$  (1.778 m)   Wt 246 lb 2 oz (111.6 kg)   BMI 35.32 kg/m?   ?Wt Readings from Last 3 Encounters:  ?12/09/21 246 lb 2 oz (111.6 kg)  ?09/26/21 245 lb (111.1 kg)  ?06/24/21 236 lb (107 kg)  ?  ?Physical Exam ?Vitals and nursing note reviewed.  ?Constitutional:   ?   General: He is not in acute distress. ?   Appearance: He is well-developed. He is not diaphoretic.  ?HENT:  ?   Right Ear: Tympanic membrane and ear canal  normal. There is no impacted cerumen.  ?   Left Ear: Tympanic membrane and ear canal normal. There is no impacted cerumen.  ?   Nose: Congestion present.  ?   Mouth/Throat:  ?   Mouth: Mucous membranes are moist.  ?   Pharynx: No oropharyngeal exudate or posterior oropharyngeal erythema.  ?Eyes:  ?   General: No scleral icterus. ?   Conjunctiva/sclera: Conjunctivae normal.  ?Neck:  ?   Thyroid: No thyromegaly.  ?   Vascular: Normal carotid pulses. No carotid bruit.  ?Cardiovascular:  ?   Rate and Rhythm: Normal rate and regular rhythm.  ?   Heart sounds: Normal heart sounds. No murmur heard. ?Pulmonary:  ?   Effort: Pulmonary effort is normal. No respiratory distress.  ?   Breath sounds: Normal breath sounds. No wheezing.  ?Musculoskeletal:     ?   General: Normal range of motion.  ?   Cervical back: Neck supple.  ?Lymphadenopathy:  ?   Cervical: No cervical adenopathy.  ?Skin: ?   General:  Skin is warm and dry.  ?   Findings: No rash.  ?Neurological:  ?   Mental Status: He is alert and oriented to person, place, and time.  ?   Coordination: Coordination normal.  ?Psychiatric:     ?   Behavior: Behavior normal.  ? ? ? ? ?Assessment & Plan:  ? ?Problem List Items Addressed This Visit   ?None ?Visit Diagnoses   ? ? Tinnitus of both ears    -  Primary  ? ?  ?  ?He describes it as a constant and not a pulsating, recommend allergy medicine for couple weeks and if not improved we may go see ENT. ?Follow up plan: ?Return if symptoms worsen or fail to improve. ? ?Counseling provided for all of the vaccine components ?No orders of the defined types were placed in this encounter. ? ? ?Caryl Pina, MD ?[place] ?12/09/2021, 11:45 AM ? ? ? ? ?

## 2021-12-25 ENCOUNTER — Ambulatory Visit (INDEPENDENT_AMBULATORY_CARE_PROVIDER_SITE_OTHER): Payer: Medicare Other | Admitting: Family Medicine

## 2021-12-25 ENCOUNTER — Telehealth: Payer: Self-pay | Admitting: Family Medicine

## 2021-12-25 ENCOUNTER — Encounter: Payer: Self-pay | Admitting: Family Medicine

## 2021-12-25 VITALS — BP 115/67 | HR 63 | Ht 70.0 in | Wt 236.0 lb

## 2021-12-25 DIAGNOSIS — I10 Essential (primary) hypertension: Secondary | ICD-10-CM

## 2021-12-25 DIAGNOSIS — E785 Hyperlipidemia, unspecified: Secondary | ICD-10-CM | POA: Diagnosis not present

## 2021-12-25 DIAGNOSIS — E1169 Type 2 diabetes mellitus with other specified complication: Secondary | ICD-10-CM | POA: Diagnosis not present

## 2021-12-25 DIAGNOSIS — Z23 Encounter for immunization: Secondary | ICD-10-CM

## 2021-12-25 DIAGNOSIS — Z1211 Encounter for screening for malignant neoplasm of colon: Secondary | ICD-10-CM

## 2021-12-25 LAB — BAYER DCA HB A1C WAIVED: HB A1C (BAYER DCA - WAIVED): 6.5 % — ABNORMAL HIGH (ref 4.8–5.6)

## 2021-12-25 MED ORDER — LOSARTAN POTASSIUM 50 MG PO TABS: 100.0000 mg | ORAL_TABLET | Freq: Every day | ORAL | 3 refills | Status: DC

## 2021-12-25 MED ORDER — EMPAGLIFLOZIN 10 MG PO TABS: 10.0000 mg | ORAL_TABLET | Freq: Every day | ORAL | 3 refills | Status: DC

## 2021-12-25 NOTE — Progress Notes (Signed)
? ?BP 115/67   Pulse 63   Ht '5\' 10"'$  (1.778 m)   Wt 236 lb (107 kg)   SpO2 94%   BMI 33.86 kg/m?   ? ?Subjective:  ? ?Patient ID: David Tate, male    DOB: 1956/05/30, 66 y.o.   MRN: [id number] ? ?HPI: ?David Tate is a 66 y.o. male presenting on 12/25/2021 for Medical Management of Chronic Issues and Diabetes ? ? ?HPI ?Type 2 diabetes mellitus ?Patient comes in today for recheck of his diabetes. Patient has been currently taking metformin and Jardiance, A1c 6.5. Patient is currently on an ACE inhibitor/ARB. Patient has not seen an ophthalmologist this year. Patient denies any issues with their feet. The symptom started onset as an adult hypertension and hyperlipidemia ARE RELATED TO DM  ? ?Hypertension ?Patient is currently on losartan, and their blood pressure today is 115/67. Patient denies any lightheadedness or dizziness. Patient denies headaches, blurred vision, chest pains, shortness of breath, or weakness. Denies any side effects from medication and is content with current medication.  ? ?Hyperlipidemia ?Patient is coming in for recheck of his hyperlipidemia. The patient is currently taking pravastatin. They deny any issues with myalgias or history of liver damage from it. They deny any focal numbness or weakness or chest pain.  ? ?Relevant past medical, surgical, family and social history reviewed and updated as indicated. Interim medical history since our last visit reviewed. ?Allergies and medications reviewed and updated. ? ?Review of Systems  ?Constitutional:  Negative for chills and fever.  ?Eyes:  Negative for visual disturbance.  ?Respiratory:  Negative for shortness of breath and wheezing.   ?Cardiovascular:  Negative for chest pain and leg swelling.  ?Musculoskeletal:  Negative for back pain and gait problem.  ?Skin:  Negative for rash.  ?Neurological:  Negative for dizziness, weakness and light-headedness.  ?All other systems reviewed and are negative. ? ?Per HPI unless specifically indicated  above ? ? ?Allergies as of 12/25/2021   ? ?   Reactions  ? Lisinopril Cough  ? ?  ? ?  ?Medication List  ?  ? ?  ? Accurate as of December 25, 2021 11:40 AM. If you have any questions, ask your nurse or doctor.  ?  ?  ? ?  ? ?empagliflozin 10 MG Tabs tablet ?Commonly known as: Jardiance ?Take 1 tablet (10 mg total) by mouth daily before breakfast. ?  ?losartan 50 MG tablet ?Commonly known as: COZAAR ?Take 2 tablets (100 mg total) by mouth daily. ?  ?metFORMIN 500 MG tablet ?Commonly known as: GLUCOPHAGE ?Take 1,000 mg by mouth daily. ?  ?pravastatin 20 MG tablet ?Commonly known as: PRAVACHOL ?Take 1 tablet (20 mg total) by mouth daily. ?  ?Rivaroxaban 15 MG Tabs tablet ?Commonly known as: XARELTO ?Take 1 tablet (15 mg total) by mouth daily with supper. ?  ?sertraline 50 MG tablet ?Commonly known as: ZOLOFT ?Take 1 tablet (50 mg total) by mouth daily. ?  ?sildenafil 20 MG tablet ?Commonly known as: REVATIO ?Take 1-3 tablets (20-60 mg total) by mouth as needed. ?  ? ?  ? ? ? ?Objective:  ? ?BP 115/67   Pulse 63   Ht '5\' 10"'$  (1.778 m)   Wt 236 lb (107 kg)   SpO2 94%   BMI 33.86 kg/m?   ?Wt Readings from Last 3 Encounters:  ?12/25/21 236 lb (107 kg)  ?12/09/21 246 lb 2 oz (111.6 kg)  ?09/26/21 245 lb (111.1 kg)  ?  ?Physical Exam ?Vitals and  nursing note reviewed.  ?Constitutional:   ?   General: He is not in acute distress. ?   Appearance: He is well-developed. He is not diaphoretic.  ?Eyes:  ?   General: No scleral icterus. ?   Conjunctiva/sclera: Conjunctivae normal.  ?Neck:  ?   Thyroid: No thyromegaly.  ?Cardiovascular:  ?   Rate and Rhythm: Normal rate and regular rhythm.  ?   Heart sounds: Normal heart sounds. No murmur heard. ?Pulmonary:  ?   Effort: Pulmonary effort is normal. No respiratory distress.  ?   Breath sounds: Normal breath sounds. No wheezing.  ?Musculoskeletal:     ?   General: No swelling. Normal range of motion.  ?   Cervical back: Neck supple.  ?Lymphadenopathy:  ?   Cervical: No cervical  adenopathy.  ?Skin: ?   General: Skin is warm and dry.  ?   Findings: No rash.  ?Neurological:  ?   Mental Status: He is alert and oriented to person, place, and time.  ?   Coordination: Coordination normal.  ?Psychiatric:     ?   Behavior: Behavior normal.  ? ? ? ? ?Assessment & Plan:  ? ?Problem List Items Addressed This Visit   ? ?  ? Cardiovascular and Mediastinum  ? Hypertension  ? Relevant Medications  ? losartan (COZAAR) 50 MG tablet  ?  ? Endocrine  ? Type 2 diabetes mellitus with other specified complication ([place]) - Primary  ? Relevant Medications  ? losartan (COZAAR) 50 MG tablet  ? empagliflozin (JARDIANCE) 10 MG TABS tablet  ? Other Relevant Orders  ? Bayer DCA Hb A1c Waived  ? Hyperlipidemia associated with type 2 diabetes mellitus ([place])  ? Relevant Medications  ? losartan (COZAAR) 50 MG tablet  ? empagliflozin (JARDIANCE) 10 MG TABS tablet  ? ?Other Visit Diagnoses   ? ? Colon cancer screening      ? Relevant Orders  ? Ambulatory referral to Gastroenterology  ? ?  ?Continue current medicine, A1c looks good at 6.5.  Will refer to gastroenterology. ? ?Follow up plan: ?Return in about 3 months (around 03/27/2022), or if symptoms worsen or fail to improve, for Diabetes. ? ?Counseling provided for all of the vaccine components ?Orders Placed This Encounter  ?Procedures  ? Bayer DCA Hb A1c Waived  ? Ambulatory referral to Gastroenterology  ? ? ?Caryl Pina, MD ?[place] ?12/25/2021, 11:40 AM ? ? ? ? ?

## 2021-12-25 NOTE — Telephone Encounter (Signed)
Patient calling to let Dr Dettinger know that the gastro doctor that he would like to be referred to is Dr Lamount Cohen. He is unsure of the name of the office. Please call back with any questions.  ?

## 2021-12-25 NOTE — Addendum Note (Signed)
Addended by: Alphonzo Dublin on: 12/25/2021 12:09 PM ? ? Modules accepted: Orders ? ?

## 2022-01-29 ENCOUNTER — Ambulatory Visit (INDEPENDENT_AMBULATORY_CARE_PROVIDER_SITE_OTHER): Payer: Medicare Other

## 2022-01-29 ENCOUNTER — Telehealth: Payer: Self-pay

## 2022-01-29 VITALS — Wt 236.0 lb

## 2022-01-29 DIAGNOSIS — Z0000 Encounter for general adult medical examination without abnormal findings: Secondary | ICD-10-CM

## 2022-01-29 NOTE — Telephone Encounter (Addendum)
Please check status of GI referral to [facility] - sent last month 12/2021. Thanks! ?He verbally gave permission to leave detailed message if he doesn't answer. ?

## 2022-01-29 NOTE — Progress Notes (Signed)
? ?Subjective:  ? David Tate is a 66 y.o. male who presents for Medicare Annual/Subsequent preventive examination. ? ?Virtual Visit via Telephone Note ? ?I connected with  [name] on 01/29/22 at  2:45 PM EDT by telephone and verified that I am speaking with the correct person using two identifiers. ? ?Location: ?Patient: Home ?Provider: [facility] ?Persons participating in the virtual visit: patient/Nurse Health Advisor ?  ?I discussed the limitations, risks, security and privacy concerns of performing an evaluation and management service by telephone and the availability of in person appointments. The patient expressed understanding and agreed to proceed. ? ?Interactive audio and video telecommunications were attempted between this nurse and patient, however failed, due to patient having technical difficulties OR patient did not have access to video capability.  We continued and completed visit with audio only. ? ?Some vital signs may be absent or patient reported.  ? ?David [name], LPN  ? ?Review of Systems    ? ?Cardiac Risk Factors include: advanced age (>54mn, >>94women);diabetes mellitus;dyslipidemia;hypertension;male gender;sedentary lifestyle;obesity (BMI >30kg/m2);Other (see comment);smoking/ tobacco exposure, Risk factor comments: hx of DVT, venous insufficiency ? ?   ?Objective:  ?  ?Today's Vitals  ? 01/29/22 1438  ?Weight: 236 lb (107 kg)  ? ?Body mass index is 33.86 kg/m?. ? ? ?  01/29/2022  ?  2:46 PM 01/28/2021  ?  4:10 PM 05/02/2019  ? 12:08 PM 12/09/2018  ? 10:10 AM  ?Advanced Directives  ?Does Patient Have a Medical Advance Directive? No No No No  ?Would patient like information on creating a medical advance directive? No - Patient declined Yes (MAU/Ambulatory/Procedural Areas - Information given)    ? ? ?Current Medications (verified) ?Outpatient Encounter Medications as of 01/29/2022  ?Medication Sig  ? empagliflozin (JARDIANCE) 10 MG TABS tablet Take 1 tablet (10 mg total) by mouth daily before  breakfast.  ? losartan (COZAAR) 50 MG tablet Take 2 tablets (100 mg total) by mouth daily.  ? metFORMIN (GLUCOPHAGE) 500 MG tablet Take 1,000 mg by mouth daily.  ? pravastatin (PRAVACHOL) 20 MG tablet Take 1 tablet (20 mg total) by mouth daily.  ? Rivaroxaban (XARELTO) 15 MG TABS tablet Take 1 tablet (15 mg total) by mouth daily with supper.  ? sertraline (ZOLOFT) 50 MG tablet Take 1 tablet (50 mg total) by mouth daily.  ? sildenafil (REVATIO) 20 MG tablet Take 1-3 tablets (20-60 mg total) by mouth as needed.  ? ?No facility-administered encounter medications on file as of 01/29/2022.  ? ? ?Allergies (verified) ?Lisinopril and Latex  ? ?History: ?Past Medical History:  ?Diagnosis Date  ? Diabetes mellitus without complication ([place]   ? H/O blood clots   ? ?Past Surgical History:  ?Procedure Laterality Date  ? LEG SURGERY Left   ? ?Family History  ?Problem Relation Age of Onset  ? COPD Mother   ? Colon cancer Son   ? ?Social History  ? ?Socioeconomic History  ? Marital status: Divorced  ?  Spouse name: Not on file  ? Number of children: 2  ? Years of education: Not on file  ? Highest education level: Not on file  ?Occupational History  ?  Comment: retired  ?Tobacco Use  ? Smoking status: Former  ?  Packs/day: 1.00  ?  Years: 50.00  ?  Pack years: 50.00  ?  Types: Cigarettes  ?  Quit date: 01/2017  ?  Years since quitting: 5.0  ? Smokeless tobacco: Never  ? Tobacco comments:  ?  e cigarrettes now and coming down off of them  ?Vaping Use  ? Vaping Use: Every day  ?Substance and Sexual Activity  ? Alcohol use: Yes  ?  Alcohol/week: 2.0 standard drinks  ?  Types: 2 Standard drinks or equivalent per week  ?  Comment: occ - once per month  ? Drug use: Never  ? Sexual activity: Yes  ?  Comment: married 3.5 years  ?Other Topics Concern  ? Not on file  ?Social History Narrative  ? Lives with significant other; has 2 children who live a couple of hours away  ? ?Social Determinants of Health  ? ?Financial Resource Strain: Low  Risk   ? Difficulty of Paying Living Expenses: Not very hard  ?Food Insecurity: No Food Insecurity  ? Worried About Charity fundraiser in the Last Year: Never true  ? Ran Out of Food in the Last Year: Never true  ?Transportation Needs: No Transportation Needs  ? Lack of Transportation (Medical): No  ? Lack of Transportation (Non-Medical): No  ?Physical Activity: Insufficiently Active  ? Days of Exercise per Week: 7 days  ? Minutes of Exercise per Session: 20 min  ?Stress: No Stress Concern Present  ? Feeling of Stress : Only a little  ?Social Connections: Moderately Isolated  ? Frequency of Communication with Friends and Family: More than three times a week  ? Frequency of Social Gatherings with Friends and Family: More than three times a week  ? Attends Religious Services: Never  ? Active Member of Clubs or Organizations: No  ? Attends Archivist Meetings: Never  ? Marital Status: Living with partner  ? ? ?Tobacco Counseling ?Counseling given: Not Answered ?Tobacco comments: e cigarrettes now and coming down off of them ? ? ?Clinical Intake: ? ?Pre-visit preparation completed: Yes ? ?Pain : No/denies pain ? ?  ? ?BMI - recorded: 33.86 ?Nutritional Status: BMI > 30  Obese ?Nutritional Risks: None ?Diabetes: Yes ?CBG done?: No ?Did pt. bring in CBG monitor from home?: No ? ?How often do you need to have someone help you when you read instructions, pamphlets, or other written materials from your doctor or pharmacy?: 1 - Never ? ?Diabetic? Nutrition Risk Assessment: ? ?Has the patient had any N/V/D within the last 2 months?  No  ?Does the patient have any non-healing wounds?  No  ?Has the patient had any unintentional weight loss or weight gain?  No  ? ?Diabetes: ? ?Is the patient diabetic?  Yes  ?If diabetic, was a CBG obtained today?  No  ?Did the patient bring in their glucometer from home?  No  ?How often do you monitor your CBG's? never.  ? ?Financial Strains and Diabetes Management: ? ?Are you having  any financial strains with the device, your supplies or your medication? No .  ?Does the patient want to be seen by Chronic Care Management for management of their diabetes?  No  ?Would the patient like to be referred to a Nutritionist or for Diabetic Management?  No  ? ?Diabetic Exams: ? ?Diabetic Eye Exam: Completed 2020. Overdue for diabetic eye exam. Pt has been advised about the importance in completing this exam. Patient declines referral, but says he will do some research and make appt soon. ? ?Diabetic Foot Exam: Completed 12/25/2021. Pt has been advised about the importance in completing this exam. ? ?Interpreter Needed?: No ? ?Information entered by :: Antinio Sanderfer, LPN ? ? ?Activities of Daily Living ? ?  01/29/2022  ?  2:46 PM  ?In your present state of health, do you have any difficulty performing the following activities:  ?Hearing? 0  ?Vision? 0  ?Difficulty concentrating or making decisions? 0  ?Walking or climbing stairs? 0  ?Dressing or bathing? 0  ?Doing errands, shopping? 0  ?Preparing Food and eating ? N  ?Using the Toilet? N  ?In the past six months, have you accidently leaked urine? N  ?Do you have problems with loss of bowel control? N  ?Managing your Medications? N  ?Managing your Finances? N  ?Housekeeping or managing your Housekeeping? N  ? ? ?Patient Care Team: ?Dettinger, Fransisca Kaufmann, MD as PCP - General (Family Medicine) ?Lucienne Capers, MD as Referring Physician (Internal Medicine) ? ?Indicate any recent Medical Services you may have received from other than [facility] providers in the past year (date may be approximate). ? ?   ?Assessment:  ? This is a routine wellness examination for [place]. ? ?Hearing/Vision screen ?Hearing Screening - Comments:: Denies hearing difficulties   ?Vision Screening - Comments:: Wears rx glasses - behind on routine eye exams - hasn't found an optometrist here yet - plans to soon - declines referral  ? ?Dietary issues and exercise activities discussed: ?Current  Exercise Habits: Home exercise routine, Type of exercise: walking;Other - see comments (yard work), Time (Minutes): 20, Frequency (Times/Week): 7, Weekly Exercise (Minutes/Week): 140, Intensity: Mild, Exercise l

## 2022-01-29 NOTE — Patient Instructions (Signed)
[name] , ?Thank you for taking time to come for your Medicare Wellness Visit. I appreciate your ongoing commitment to your health goals. Please review the following plan we discussed and let me know if I can assist you in the future.  ? ?Screening recommendations/referrals: ?Colonoscopy: Cologuard done 12/21/2018 - referred to GI last month - waiting for referral status/ appointment ?Recommended yearly ophthalmology/optometry visit for glaucoma screening and checkup ?Recommended yearly dental visit for hygiene and checkup ? ?Vaccinations: ?Influenza vaccine: Done 2022 - Repeat annually ?Pneumococcal vaccine: Done 12/19/2020 & 06/24/2021  ?Tdap vaccine: Due - every 10 years ?Shingles vaccine: Done 06/24/2021 & 12/25/2021   ?Covid-19: Done 12/30/2019 & 01/31/2020 - for boosters, contact pharmacy ? ?Advanced directives: Advance directive discussed with you today. Even though you declined this today, please call our office should you change your mind, and we can give you the proper paperwork for you to fill out.  ? ?Conditions/risks identified: Aim for 30 minutes of exercise or brisk walking, 6-8 glasses of water, and 5 servings of fruits and vegetables each day.  ? ?Next appointment: Follow up in one year for your annual wellness visit.  ? ?Preventive Care 25 Years and Older, Male ? ?Preventive care refers to lifestyle choices and visits with your health care provider that can promote health and wellness. ?What does preventive care include? ?A yearly physical exam. This is also called an annual well check. ?Dental exams once or twice a year. ?Routine eye exams. Ask your health care provider how often you should have your eyes checked. ?Personal lifestyle choices, including: ?Daily care of your teeth and gums. ?Regular physical activity. ?Eating a healthy diet. ?Avoiding tobacco and drug use. ?Limiting alcohol use. ?Practicing safe sex. ?Taking low doses of aspirin every day. ?Taking vitamin and mineral supplements as  recommended by your health care provider. ?What happens during an annual well check? ?The services and screenings done by your health care provider during your annual well check will depend on your age, overall health, lifestyle risk factors, and family history of disease. ?Counseling  ?Your health care provider may ask you questions about your: ?Alcohol use. ?Tobacco use. ?Drug use. ?Emotional well-being. ?Home and relationship well-being. ?Sexual activity. ?Eating habits. ?History of falls. ?Memory and ability to understand (cognition). ?Work and work Statistician. ?Screening  ?You may have the following tests or measurements: ?Height, weight, and BMI. ?Blood pressure. ?Lipid and cholesterol levels. These may be checked every 5 years, or more frequently if you are over 80 years old. ?Skin check. ?Lung cancer screening. You may have this screening every year starting at age 51 if you have a 30-pack-year history of smoking and currently smoke or have quit within the past 15 years. ?Fecal occult blood test (FOBT) of the stool. You may have this test every year starting at age 81. ?Flexible sigmoidoscopy or colonoscopy. You may have a sigmoidoscopy every 5 years or a colonoscopy every 10 years starting at age 63. ?Prostate cancer screening. Recommendations will vary depending on your family history and other risks. ?Hepatitis C blood test. ?Hepatitis B blood test. ?Sexually transmitted disease (STD) testing. ?Diabetes screening. This is done by checking your blood sugar (glucose) after you have not eaten for a while (fasting). You may have this done every 1-3 years. ?Abdominal aortic aneurysm (AAA) screening. You may need this if you are a current or former smoker. ?Osteoporosis. You may be screened starting at age 52 if you are at high risk. ?Talk with your health care provider  about your test results, treatment options, and if necessary, the need for more tests. ?Vaccines  ?Your health care provider may recommend  certain vaccines, such as: ?Influenza vaccine. This is recommended every year. ?Tetanus, diphtheria, and acellular pertussis (Tdap, Td) vaccine. You may need a Td booster every 10 years. ?Zoster vaccine. You may need this after age 25. ?Pneumococcal 13-valent conjugate (PCV13) vaccine. One dose is recommended after age 60. ?Pneumococcal polysaccharide (PPSV23) vaccine. One dose is recommended after age 36. ?Talk to your health care provider about which screenings and vaccines you need and how often you need them. ?This information is not intended to replace advice given to you by your health care provider. Make sure you discuss any questions you have with your health care provider. ?Document Released: 10/19/2015 Document Revised: 06/11/2016 Document Reviewed: 07/24/2015 ?Elsevier Interactive Patient Education ? 2017 [place]. ? ?Fall Prevention in the Home ?Falls can cause injuries. They can happen to people of all ages. There are many things you can do to make your home safe and to help prevent falls. ?What can I do on the outside of my home? ?Regularly fix the edges of walkways and driveways and fix any cracks. ?Remove anything that might make you trip as you walk through a door, such as a raised step or threshold. ?Trim any bushes or trees on the path to your home. ?Use bright outdoor lighting. ?Clear any walking paths of anything that might make someone trip, such as rocks or tools. ?Regularly check to see if handrails are loose or broken. Make sure that both sides of any steps have handrails. ?Any raised decks and porches should have guardrails on the edges. ?Have any leaves, snow, or ice cleared regularly. ?Use sand or salt on walking paths during winter. ?Clean up any spills in your garage right away. This includes oil or grease spills. ?What can I do in the bathroom? ?Use night lights. ?Install grab bars by the toilet and in the tub and shower. Do not use towel bars as grab bars. ?Use non-skid mats or  decals in the tub or shower. ?If you need to sit down in the shower, use a plastic, non-slip stool. ?Keep the floor dry. Clean up any water that spills on the floor as soon as it happens. ?Remove soap buildup in the tub or shower regularly. ?Attach bath mats securely with double-sided non-slip rug tape. ?Do not have throw rugs and other things on the floor that can make you trip. ?What can I do in the bedroom? ?Use night lights. ?Make sure that you have a light by your bed that is easy to reach. ?Do not use any sheets or blankets that are too big for your bed. They should not hang down onto the floor. ?Have a firm chair that has side arms. You can use this for support while you get dressed. ?Do not have throw rugs and other things on the floor that can make you trip. ?What can I do in the kitchen? ?Clean up any spills right away. ?Avoid walking on wet floors. ?Keep items that you use a lot in easy-to-reach places. ?If you need to reach something above you, use a strong step stool that has a grab bar. ?Keep electrical cords out of the way. ?Do not use floor polish or wax that makes floors slippery. If you must use wax, use non-skid floor wax. ?Do not have throw rugs and other things on the floor that can make you trip. ?What can I do  with my stairs? ?Do not leave any items on the stairs. ?Make sure that there are handrails on both sides of the stairs and use them. Fix handrails that are broken or loose. Make sure that handrails are as long as the stairways. ?Check any carpeting to make sure that it is firmly attached to the stairs. Fix any carpet that is loose or worn. ?Avoid having throw rugs at the top or bottom of the stairs. If you do have throw rugs, attach them to the floor with carpet tape. ?Make sure that you have a light switch at the top of the stairs and the bottom of the stairs. If you do not have them, ask someone to add them for you. ?What else can I do to help prevent falls? ?Wear shoes that: ?Do not  have high heels. ?Have rubber bottoms. ?Are comfortable and fit you well. ?Are closed at the toe. Do not wear sandals. ?If you use a stepladder: ?Make sure that it is fully opened. Do not climb a closed stepladd

## 2022-03-28 ENCOUNTER — Encounter: Payer: Self-pay | Admitting: Family Medicine

## 2022-03-28 ENCOUNTER — Ambulatory Visit (INDEPENDENT_AMBULATORY_CARE_PROVIDER_SITE_OTHER): Payer: Medicare Other | Admitting: Family Medicine

## 2022-03-28 VITALS — BP 124/69 | HR 66 | Temp 98.0°F | Ht 70.0 in | Wt 227.0 lb

## 2022-03-28 DIAGNOSIS — E785 Hyperlipidemia, unspecified: Secondary | ICD-10-CM | POA: Diagnosis not present

## 2022-03-28 DIAGNOSIS — E1169 Type 2 diabetes mellitus with other specified complication: Secondary | ICD-10-CM

## 2022-03-28 DIAGNOSIS — Z23 Encounter for immunization: Secondary | ICD-10-CM | POA: Diagnosis not present

## 2022-03-28 DIAGNOSIS — I10 Essential (primary) hypertension: Secondary | ICD-10-CM | POA: Diagnosis not present

## 2022-03-28 DIAGNOSIS — F410 Panic disorder [episodic paroxysmal anxiety] without agoraphobia: Secondary | ICD-10-CM

## 2022-03-28 DIAGNOSIS — Z86718 Personal history of other venous thrombosis and embolism: Secondary | ICD-10-CM

## 2022-03-28 DIAGNOSIS — N529 Male erectile dysfunction, unspecified: Secondary | ICD-10-CM

## 2022-03-28 LAB — BAYER DCA HB A1C WAIVED: HB A1C (BAYER DCA - WAIVED): 6.1 % — ABNORMAL HIGH (ref 4.8–5.6)

## 2022-03-28 MED ORDER — SERTRALINE HCL 50 MG PO TABS: 50.0000 mg | ORAL_TABLET | Freq: Every day | ORAL | 3 refills | Status: DC

## 2022-03-28 MED ORDER — PRAVASTATIN SODIUM 20 MG PO TABS: 20.0000 mg | ORAL_TABLET | Freq: Every day | ORAL | 3 refills | Status: DC

## 2022-03-28 MED ORDER — SILDENAFIL CITRATE 20 MG PO TABS: 20.0000 mg | ORAL_TABLET | ORAL | 1 refills | Status: DC | PRN

## 2022-03-28 MED ORDER — RIVAROXABAN 15 MG PO TABS: 15.0000 mg | ORAL_TABLET | Freq: Every day | ORAL | 3 refills | Status: DC

## 2022-03-29 LAB — CMP14+EGFR
ALT: 18 IU/L (ref 0–44)
AST: 19 IU/L (ref 0–40)
Albumin/Globulin Ratio: 2 (ref 1.2–2.2)
Albumin: 4.6 g/dL (ref 3.8–4.8)
Alkaline Phosphatase: 133 IU/L — ABNORMAL HIGH (ref 44–121)
BUN/Creatinine Ratio: 20 (ref 10–24)
BUN: 26 mg/dL (ref 8–27)
Bilirubin Total: 0.4 mg/dL (ref 0.0–1.2)
CO2: 23 mmol/L (ref 20–29)
Calcium: 9.7 mg/dL (ref 8.6–10.2)
Chloride: 102 mmol/L (ref 96–106)
Creatinine, Ser: 1.32 mg/dL — ABNORMAL HIGH (ref 0.76–1.27)
Globulin, Total: 2.3 g/dL (ref 1.5–4.5)
Glucose: 152 mg/dL — ABNORMAL HIGH (ref 70–99)
Potassium: 4.4 mmol/L (ref 3.5–5.2)
Sodium: 141 mmol/L (ref 134–144)
Total Protein: 6.9 g/dL (ref 6.0–8.5)
eGFR: 59 mL/min/{1.73_m2} — ABNORMAL LOW (ref >59)

## 2022-03-29 LAB — CBC WITH DIFFERENTIAL/PLATELET
Basophils Absolute: 0.1 10*3/uL (ref 0.0–0.2)
Basos: 1 %
EOS (ABSOLUTE): 0.2 10*3/uL (ref 0.0–0.4)
Eos: 2 %
Hematocrit: 50.7 % (ref 37.5–51.0)
Hemoglobin: 17 g/dL (ref 13.0–17.7)
Immature Grans (Abs): 0.1 10*3/uL (ref 0.0–0.1)
Immature Granulocytes: 1 %
Lymphocytes Absolute: 2.4 10*3/uL (ref 0.7–3.1)
Lymphs: 24 %
MCH: 28.8 pg (ref 26.6–33.0)
MCHC: 33.5 g/dL (ref 31.5–35.7)
MCV: 86 fL (ref 79–97)
Monocytes Absolute: 0.6 10*3/uL (ref 0.1–0.9)
Monocytes: 6 %
Neutrophils Absolute: 6.5 10*3/uL (ref 1.4–7.0)
Neutrophils: 66 %
Platelets: 213 10*3/uL (ref 150–450)
RBC: 5.91 x10E6/uL — ABNORMAL HIGH (ref 4.14–5.80)
RDW: 12.4 % (ref 11.6–15.4)
WBC: 10 10*3/uL (ref 3.4–10.8)

## 2022-03-29 LAB — LIPID PANEL
Chol/HDL Ratio: 4.4 ratio (ref 0.0–5.0)
Cholesterol, Total: 145 mg/dL (ref 100–199)
HDL: 33 mg/dL — ABNORMAL LOW (ref >39)
LDL Chol Calc (NIH): 88 mg/dL (ref 0–99)
Triglycerides: 131 mg/dL (ref 0–149)
VLDL Cholesterol Cal: 24 mg/dL (ref 5–40)

## 2022-04-22 DIAGNOSIS — D123 Benign neoplasm of transverse colon: Secondary | ICD-10-CM | POA: Diagnosis not present

## 2022-04-22 DIAGNOSIS — D122 Benign neoplasm of ascending colon: Secondary | ICD-10-CM | POA: Diagnosis not present

## 2022-04-22 DIAGNOSIS — Z800 Family history of malignant neoplasm of digestive organs: Secondary | ICD-10-CM | POA: Diagnosis not present

## 2022-04-22 DIAGNOSIS — I4891 Unspecified atrial fibrillation: Secondary | ICD-10-CM | POA: Diagnosis not present

## 2022-04-22 DIAGNOSIS — D128 Benign neoplasm of rectum: Secondary | ICD-10-CM | POA: Diagnosis not present

## 2022-04-22 DIAGNOSIS — Z1211 Encounter for screening for malignant neoplasm of colon: Secondary | ICD-10-CM | POA: Diagnosis not present

## 2022-06-30 ENCOUNTER — Ambulatory Visit (INDEPENDENT_AMBULATORY_CARE_PROVIDER_SITE_OTHER): Payer: Medicare Other | Admitting: Family Medicine

## 2022-06-30 ENCOUNTER — Encounter: Payer: Self-pay | Admitting: Family Medicine

## 2022-06-30 VITALS — BP 114/70 | HR 58 | Temp 98.0°F | Ht 70.0 in | Wt 227.0 lb

## 2022-06-30 DIAGNOSIS — I10 Essential (primary) hypertension: Secondary | ICD-10-CM

## 2022-06-30 DIAGNOSIS — E785 Hyperlipidemia, unspecified: Secondary | ICD-10-CM | POA: Diagnosis not present

## 2022-06-30 DIAGNOSIS — Z23 Encounter for immunization: Secondary | ICD-10-CM

## 2022-06-30 DIAGNOSIS — E1169 Type 2 diabetes mellitus with other specified complication: Secondary | ICD-10-CM | POA: Diagnosis not present

## 2022-06-30 LAB — BAYER DCA HB A1C WAIVED: HB A1C (BAYER DCA - WAIVED): 5.7 % — ABNORMAL HIGH (ref 4.8–5.6)

## 2022-06-30 NOTE — Progress Notes (Signed)
BP 114/70   Pulse (!) 58   Temp 98 F (36.7 C)   Ht _0  (1.778 m)   Wt 227 lb (103 kg)   SpO2 95%   BMI 32.57 kg/m    Subjective:   Patient ID: David Tate, male    DOB: 12/30/1955, 66 y.o.   MRN: [id number]  HPI: David Tate is a 66 y.o. male presenting on 06/30/2022 for Medical Management of Chronic Issues and Diabetes   HPI Type 2 diabetes mellitus Patient comes in today for recheck of his diabetes. Patient has been currently taking Jardiance and metformin. Patient is currently on an ACE inhibitor/ARB. Patient has not seen an ophthalmologist this year. Patient denies any issues with their feet. The symptom started onset as an adult hypertension and hyperlipidemia ARE RELATED TO DM   Hypertension Patient is currently on losartan, and their blood pressure today is 114/70. Patient denies any lightheadedness or dizziness. Patient denies headaches, blurred vision, chest pains, shortness of breath, or weakness. Denies any side effects from medication and is content with current medication.   Hyperlipidemia Patient is coming in for recheck of his hyperlipidemia. The patient is currently taking pravastatin. They deny any issues with myalgias or history of liver damage from it. They deny any focal numbness or weakness or chest pain.   Relevant past medical, surgical, family and social history reviewed and updated as indicated. Interim medical history since our last visit reviewed. Allergies and medications reviewed and updated.  Review of Systems  Constitutional:  Negative for chills and fever.  Eyes:  Negative for visual disturbance.  Respiratory:  Negative for shortness of breath and wheezing.   Cardiovascular:  Negative for chest pain and leg swelling.  Musculoskeletal:  Negative for back pain and gait problem.  Skin:  Negative for rash.  Neurological:  Negative for dizziness, weakness and light-headedness.  All other systems reviewed and are negative.   Per HPI unless  specifically indicated above   Allergies as of 06/30/2022       Reactions   Lisinopril Cough   Latex Hives, Itching, Rash, Swelling        Medication List        Accurate as of June 30, 2022 11:42 AM. If you have any questions, ask your nurse or doctor.          empagliflozin 10 MG Tabs tablet Commonly known as: Jardiance Take 1 tablet (10 mg total) by mouth daily before breakfast.   losartan 50 MG tablet Commonly known as: COZAAR Take 2 tablets (100 mg total) by mouth daily.   metFORMIN 500 MG tablet Commonly known as: GLUCOPHAGE Take 1,000 mg by mouth daily.   pravastatin 20 MG tablet Commonly known as: PRAVACHOL Take 1 tablet (20 mg total) by mouth daily.   Rivaroxaban 15 MG Tabs tablet Commonly known as: XARELTO Take 1 tablet (15 mg total) by mouth daily with supper.   sertraline 50 MG tablet Commonly known as: ZOLOFT Take 1 tablet (50 mg total) by mouth daily.   sildenafil 20 MG tablet Commonly known as: REVATIO Take 1-3 tablets (20-60 mg total) by mouth as needed.         Objective:   BP 114/70   Pulse (!) 58   Temp 98 F (36.7 C)   Ht _1  (1.778 m)   Wt 227 lb (103 kg)   SpO2 95%   BMI 32.57 kg/m   Wt Readings from Last 3 Encounters:  06/30/22 227 lb (103  kg)  03/28/22 227 lb (103 kg)  01/29/22 236 lb (107 kg)    Physical Exam Vitals and nursing note reviewed.  Constitutional:      General: He is not in acute distress.    Appearance: He is well-developed. He is not diaphoretic.  Eyes:     General: No scleral icterus.    Conjunctiva/sclera: Conjunctivae normal.  Neck:     Thyroid: No thyromegaly.  Cardiovascular:     Rate and Rhythm: Normal rate and regular rhythm.     Heart sounds: Normal heart sounds. No murmur heard. Pulmonary:     Effort: Pulmonary effort is normal. No respiratory distress.     Breath sounds: Normal breath sounds. No wheezing.  Musculoskeletal:        General: Normal range of motion.      Cervical back: Neck supple.  Lymphadenopathy:     Cervical: No cervical adenopathy.  Skin:    General: Skin is warm and dry.     Findings: No rash.  Neurological:     Mental Status: He is alert and oriented to person, place, and time.     Coordination: Coordination normal.  Psychiatric:        Behavior: Behavior normal.       Assessment & Plan:   Problem List Items Addressed This Visit       Cardiovascular and Mediastinum   Hypertension - Primary     Endocrine   Type 2 diabetes mellitus with other specified complication ([place])   Relevant Orders   BMP8+EGFR   Bayer DCA Hb A1c Waived   Hyperlipidemia associated with type 2 diabetes mellitus (HCC)    A1c 5.7, looks great, continue with current medicine and diet. Follow up plan: Return in about 3 months (around 09/29/2022), or if symptoms worsen or fail to improve, for Diabetes hypertension cholesterol.  Counseling provided for all of the vaccine components Orders Placed This Encounter  Procedures   BMP8+EGFR   Bayer DCA Hb A1c [place], MD [facility] 06/30/2022, 11:42 AM

## 2022-07-01 LAB — BMP8+EGFR
BUN/Creatinine Ratio: 21 (ref 10–24)
BUN: 22 mg/dL (ref 8–27)
CO2: 24 mmol/L (ref 20–29)
Calcium: 9.5 mg/dL (ref 8.6–10.2)
Chloride: 103 mmol/L (ref 96–106)
Creatinine, Ser: 1.05 mg/dL (ref 0.76–1.27)
Glucose: 121 mg/dL — ABNORMAL HIGH (ref 70–99)
Potassium: 4.8 mmol/L (ref 3.5–5.2)
Sodium: 141 mmol/L (ref 134–144)
eGFR: 77 mL/min/{1.73_m2} (ref >59)

## 2022-09-17 ENCOUNTER — Other Ambulatory Visit: Payer: Self-pay | Admitting: Family Medicine

## 2022-09-17 DIAGNOSIS — E1169 Type 2 diabetes mellitus with other specified complication: Secondary | ICD-10-CM

## 2022-09-17 NOTE — Telephone Encounter (Signed)
Last office visit 06/30/22 Upcoming appointment 10/08/22 Med is on med list as historical

## 2022-10-08 ENCOUNTER — Ambulatory Visit (INDEPENDENT_AMBULATORY_CARE_PROVIDER_SITE_OTHER): Payer: Medicare Other | Admitting: Family Medicine

## 2022-10-08 ENCOUNTER — Encounter: Payer: Self-pay | Admitting: Family Medicine

## 2022-10-08 VITALS — BP 125/69 | HR 63 | Temp 98.0°F | Ht 70.0 in | Wt 236.0 lb

## 2022-10-08 DIAGNOSIS — I10 Essential (primary) hypertension: Secondary | ICD-10-CM | POA: Diagnosis not present

## 2022-10-08 DIAGNOSIS — E1159 Type 2 diabetes mellitus with other circulatory complications: Secondary | ICD-10-CM | POA: Diagnosis not present

## 2022-10-08 DIAGNOSIS — E785 Hyperlipidemia, unspecified: Secondary | ICD-10-CM | POA: Diagnosis not present

## 2022-10-08 DIAGNOSIS — Z87891 Personal history of nicotine dependence: Secondary | ICD-10-CM

## 2022-10-08 DIAGNOSIS — E1169 Type 2 diabetes mellitus with other specified complication: Secondary | ICD-10-CM | POA: Diagnosis not present

## 2022-10-08 DIAGNOSIS — Z125 Encounter for screening for malignant neoplasm of prostate: Secondary | ICD-10-CM | POA: Diagnosis not present

## 2022-10-08 LAB — BAYER DCA HB A1C WAIVED: HB A1C (BAYER DCA - WAIVED): 6.6 % — ABNORMAL HIGH (ref 4.8–5.6)

## 2022-10-08 MED ORDER — LOSARTAN POTASSIUM 50 MG PO TABS: 100.0000 mg | ORAL_TABLET | Freq: Every day | ORAL | 3 refills | Status: DC

## 2022-10-08 MED ORDER — EMPAGLIFLOZIN 10 MG PO TABS: 10.0000 mg | ORAL_TABLET | Freq: Every day | ORAL | 3 refills | Status: DC

## 2022-10-08 NOTE — Progress Notes (Signed)
BP 125/69   Pulse 63   Temp 98 F (36.7 C)   Ht _0  (1.778 m)   Wt 236 lb (107 kg)   SpO2 97%   BMI 33.86 kg/m    Subjective:   Patient ID: David Tate, male    DOB: 1956/07/18, 67 y.o.   MRN: [id number]  HPI: David Tate is a 67 y.o. male presenting on 10/08/2022 for Medical Management of Chronic Issues, Diabetes, and Hypertension   HPI Type 2 diabetes mellitus Patient comes in today for recheck of his diabetes. Patient has been currently taking metformin and Jardiance. Patient is currently on an ACE inhibitor/ARB. Patient has not seen an ophthalmologist this year. Patient denies any issues with their feet. The symptom started onset as an adult hypertension and hyperlipidemia ARE RELATED TO DM   Hypertension Patient is currently on losartan, and their blood pressure today is 125/69. Patient denies any lightheadedness or dizziness. Patient denies headaches, blurred vision, chest pains, shortness of breath, or weakness. Denies any side effects from medication and is content with current medication.   Hyperlipidemia Patient is coming in for recheck of his hyperlipidemia. The patient is currently taking pravastatin. They deny any issues with myalgias or history of liver damage from it. They deny any focal numbness or weakness or chest pain.   Relevant past medical, surgical, family and social history reviewed and updated as indicated. Interim medical history since our last visit reviewed. Allergies and medications reviewed and updated.  Review of Systems  Constitutional:  Negative for chills and fever.  Eyes:  Negative for visual disturbance.  Respiratory:  Negative for shortness of breath and wheezing.   Cardiovascular:  Negative for chest pain and leg swelling.  Musculoskeletal:  Negative for back pain and gait problem.  Skin:  Negative for rash.  Neurological:  Negative for dizziness, weakness and light-headedness.  All other systems reviewed and are negative.   Per HPI  unless specifically indicated above   Allergies as of 10/08/2022       Reactions   Lisinopril Cough   Latex Hives, Itching, Rash, Swelling        Medication List        Accurate as of October 08, 2022  1:43 PM. If you have any questions, ask your nurse or doctor.          empagliflozin 10 MG Tabs tablet Commonly known as: Jardiance Take 1 tablet (10 mg total) by mouth daily before breakfast.   losartan 50 MG tablet Commonly known as: COZAAR Take 2 tablets (100 mg total) by mouth daily.   metFORMIN 500 MG 24 hr tablet Commonly known as: GLUCOPHAGE-XR TAKE 2 TABLETS BY MOUTH TWICE A DAY What changed: how much to take   pravastatin 20 MG tablet Commonly known as: PRAVACHOL Take 1 tablet (20 mg total) by mouth daily.   Rivaroxaban 15 MG Tabs tablet Commonly known as: XARELTO Take 1 tablet (15 mg total) by mouth daily with supper.   sertraline 50 MG tablet Commonly known as: ZOLOFT Take 1 tablet (50 mg total) by mouth daily.   sildenafil 20 MG tablet Commonly known as: REVATIO Take 1-3 tablets (20-60 mg total) by mouth as needed.         Objective:   BP 125/69   Pulse 63   Temp 98 F (36.7 C)   Ht _1  (1.778 m)   Wt 236 lb (107 kg)   SpO2 97%   BMI 33.86 kg/m   Wt  Readings from Last 3 Encounters:  10/08/22 236 lb (107 kg)  06/30/22 227 lb (103 kg)  03/28/22 227 lb (103 kg)    Physical Exam Vitals and nursing note reviewed.  Constitutional:      General: He is not in acute distress.    Appearance: He is well-developed. He is not diaphoretic.  Eyes:     General: No scleral icterus.    Conjunctiva/sclera: Conjunctivae normal.  Neck:     Thyroid: No thyromegaly.  Cardiovascular:     Rate and Rhythm: Normal rate and regular rhythm.     Heart sounds: Normal heart sounds. No murmur heard. Pulmonary:     Effort: Pulmonary effort is normal. No respiratory distress.     Breath sounds: Normal breath sounds. No wheezing.  Musculoskeletal:         General: No swelling. Normal range of motion.     Cervical back: Neck supple.  Lymphadenopathy:     Cervical: No cervical adenopathy.  Skin:    General: Skin is warm and dry.     Findings: No rash.  Neurological:     Mental Status: He is alert and oriented to person, place, and time.     Coordination: Coordination normal.  Psychiatric:        Behavior: Behavior normal.       Assessment & Plan:   Problem List Items Addressed This Visit       Cardiovascular and Mediastinum   Hypertension - Primary   Relevant Medications   losartan (COZAAR) 50 MG tablet   Other Relevant Orders   CBC with Differential/Platelet   CMP14+EGFR   Lipid panel   Bayer DCA Hb A1c Waived     Endocrine   Type 2 diabetes mellitus with other specified complication (HCC)   Relevant Medications   losartan (COZAAR) 50 MG tablet   empagliflozin (JARDIANCE) 10 MG TABS tablet   Other Relevant Orders   CBC with Differential/Platelet   CMP14+EGFR   Lipid panel   Bayer DCA Hb A1c Waived   Hyperlipidemia associated with type 2 diabetes mellitus (HCC)   Relevant Medications   losartan (COZAAR) 50 MG tablet   empagliflozin (JARDIANCE) 10 MG TABS tablet   Other Relevant Orders   CBC with Differential/Platelet   CMP14+EGFR   Lipid panel   Bayer DCA Hb A1c Waived   Other Visit Diagnoses     Prostate cancer screening           A1c looks good at 6.6 and blood pressure looks good.  No changes, continue forward with current medicine. Follow up plan: Return in about 3 months (around 01/07/2023), or if symptoms worsen or fail to improve, for Diabetes hypertension and cholesterol.  Counseling provided for all of the vaccine components Orders Placed This Encounter  Procedures   CBC with Differential/Platelet   CMP14+EGFR   Lipid panel   Bayer DCA Hb A1c Waived    Caryl Pina, MD [facility] 10/08/2022, 1:43 PM

## 2022-10-08 NOTE — Addendum Note (Signed)
Addended by: Caryl Pina on: 10/08/2022 03:06 PM   Modules accepted: Orders

## 2022-10-08 NOTE — Addendum Note (Signed)
Addended by: Caryl Pina on: 10/08/2022 02:54 PM   Modules accepted: Orders

## 2022-10-09 ENCOUNTER — Other Ambulatory Visit: Payer: Self-pay

## 2022-10-09 DIAGNOSIS — Z122 Encounter for screening for malignant neoplasm of respiratory organs: Secondary | ICD-10-CM

## 2022-10-09 DIAGNOSIS — Z87891 Personal history of nicotine dependence: Secondary | ICD-10-CM

## 2022-10-09 LAB — CBC WITH DIFFERENTIAL/PLATELET
Basophils Absolute: 0.1 10*3/uL (ref 0.0–0.2)
Basos: 1 %
EOS (ABSOLUTE): 0.2 10*3/uL (ref 0.0–0.4)
Eos: 2 %
Hematocrit: 48.8 % (ref 37.5–51.0)
Hemoglobin: 15.9 g/dL (ref 13.0–17.7)
Immature Grans (Abs): 0.1 10*3/uL (ref 0.0–0.1)
Immature Granulocytes: 1 %
Lymphocytes Absolute: 2.7 10*3/uL (ref 0.7–3.1)
Lymphs: 26 %
MCH: 28 pg (ref 26.6–33.0)
MCHC: 32.6 g/dL (ref 31.5–35.7)
MCV: 86 fL (ref 79–97)
Monocytes Absolute: 0.8 10*3/uL (ref 0.1–0.9)
Monocytes: 8 %
Neutrophils Absolute: 6.2 10*3/uL (ref 1.4–7.0)
Neutrophils: 62 %
Platelets: 180 10*3/uL (ref 150–450)
RBC: 5.67 x10E6/uL (ref 4.14–5.80)
RDW: 12 % (ref 11.6–15.4)
WBC: 10.2 10*3/uL (ref 3.4–10.8)

## 2022-10-09 LAB — CMP14+EGFR
ALT: 20 IU/L (ref 0–44)
AST: 21 IU/L (ref 0–40)
Albumin/Globulin Ratio: 2 (ref 1.2–2.2)
Albumin: 4.6 g/dL (ref 3.9–4.9)
Alkaline Phosphatase: 110 IU/L (ref 44–121)
BUN/Creatinine Ratio: 19 (ref 10–24)
BUN: 20 mg/dL (ref 8–27)
Bilirubin Total: 0.4 mg/dL (ref 0.0–1.2)
CO2: 25 mmol/L (ref 20–29)
Calcium: 9.4 mg/dL (ref 8.6–10.2)
Chloride: 101 mmol/L (ref 96–106)
Creatinine, Ser: 1.06 mg/dL (ref 0.76–1.27)
Globulin, Total: 2.3 g/dL (ref 1.5–4.5)
Glucose: 110 mg/dL — ABNORMAL HIGH (ref 70–99)
Potassium: 5.1 mmol/L (ref 3.5–5.2)
Sodium: 142 mmol/L (ref 134–144)
Total Protein: 6.9 g/dL (ref 6.0–8.5)
eGFR: 76 mL/min/{1.73_m2} (ref >59)

## 2022-10-09 LAB — LIPID PANEL
Chol/HDL Ratio: 3.6 ratio (ref 0.0–5.0)
Cholesterol, Total: 177 mg/dL (ref 100–199)
HDL: 49 mg/dL (ref >39)
LDL Chol Calc (NIH): 108 mg/dL — ABNORMAL HIGH (ref 0–99)
Triglycerides: 113 mg/dL (ref 0–149)
VLDL Cholesterol Cal: 20 mg/dL (ref 5–40)

## 2022-10-09 LAB — MICROALBUMIN / CREATININE URINE RATIO
Creatinine, Urine: 70 mg/dL
Microalb/Creat Ratio: 16 mg/g creat (ref 0–29)
Microalbumin, Urine: 11.4 ug/mL

## 2022-10-09 NOTE — Progress Notes (Signed)
LDCT order placed per protocol. Patient scheduled for LDCT on 2/22 at 1045

## 2022-11-27 ENCOUNTER — Ambulatory Visit (HOSPITAL_COMMUNITY)
Admission: RE | Admit: 2022-11-27 | Discharge: 2022-11-27 | Disposition: A | Discharge status: Home / Self Care | Payer: Medicare Other | Source: Ambulatory Visit | Attending: Physician Assistant | Admitting: Physician Assistant

## 2022-11-27 DIAGNOSIS — Z122 Encounter for screening for malignant neoplasm of respiratory organs: Secondary | ICD-10-CM | POA: Insufficient documentation

## 2022-11-27 DIAGNOSIS — Z87891 Personal history of nicotine dependence: Secondary | ICD-10-CM | POA: Diagnosis not present

## 2022-12-01 ENCOUNTER — Telehealth: Payer: Self-pay

## 2022-12-01 NOTE — Telephone Encounter (Signed)
Left message making pt aware. 

## 2022-12-01 NOTE — Progress Notes (Signed)
Patient notified of LDCT Lung Cancer Screening Results via mail with the recommendation to follow-up in 12 months. Patient's referring provider has been sent a copy of results. Results are as follows:  IMPRESSION: 1. Lung-RADS 2, benign appearance or behavior. Continue annual screening with low-dose chest CT without contrast in 12 months. 2. Coronary artery disease. 3. Aortic Atherosclerosis (ICD10-I70.0) and Emphysema (ICD10-J43.9).

## 2022-12-01 NOTE — Telephone Encounter (Signed)
-----   Message from Worthy Rancher, MD sent at 12/01/2022  1:09 PM EST ----- Please let the patient know that his lung scan was normal. Caryl Pina, MD [place] 12/01/2022, 1:10 PM    ----- Message ----- From: Brien Mates, RN Sent: 12/01/2022  10:41 AM EST To: Fransisca Kaufmann Dettinger, MD  Good morning!  Patient had a recent LDCT and the results are as follows:  IMPRESSION: 1. Lung-RADS 2, benign appearance or behavior. Continue annual screening with low-dose chest CT without contrast in 12 months. 2. Coronary artery disease. 3. Aortic Atherosclerosis (ICD10-I70.0) and Emphysema (ICD10-J43.9).  We will follow-up in one year!

## 2022-12-15 IMAGING — CT CT CHEST LUNG CANCER SCREENING LOW DOSE W/O CM
2 of 3 series · 15 of 36 positions shown, 18 images · non-contrast
Comparison: None.

CLINICAL DATA: Lung cancer screening. Fifty pack-year history.
Current asymptomatic smoker.

EXAM:
CT CHEST WITHOUT CONTRAST LOW-DOSE FOR LUNG CANCER SCREENING
TECHNIQUE: Multidetector CT imaging of the chest was performed following the
standard protocol without IV contrast.

[Series 2: axial st · axial · 0.76mm/px · z∈[+1172,+1452]mm · 12 of 66 slices shown, 15 images]
[im 5/66  mediastinal]
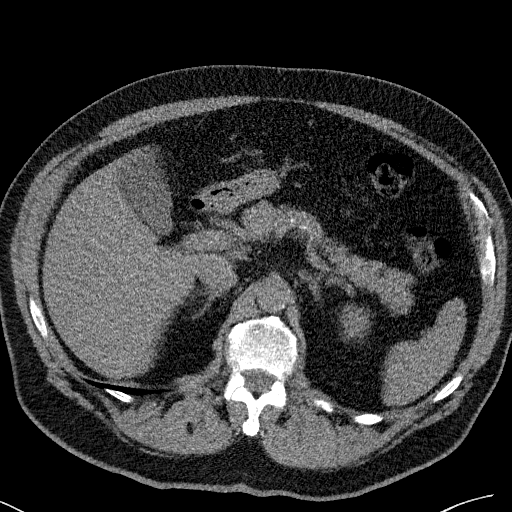
[im 5/66  lung]
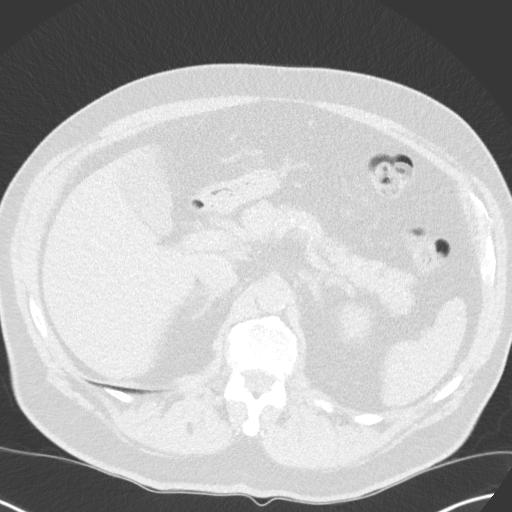
[im 10/66  lung]
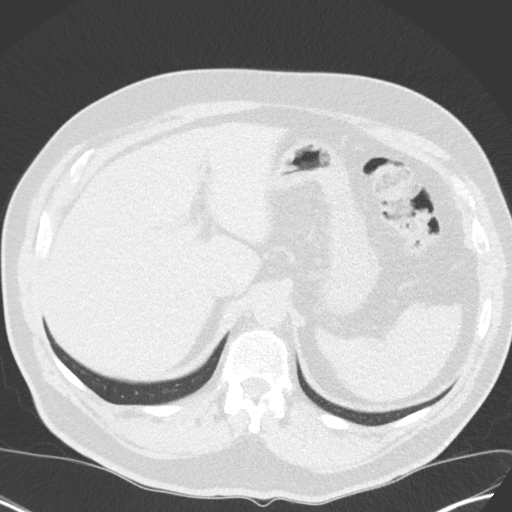
[im 15/66  lung]
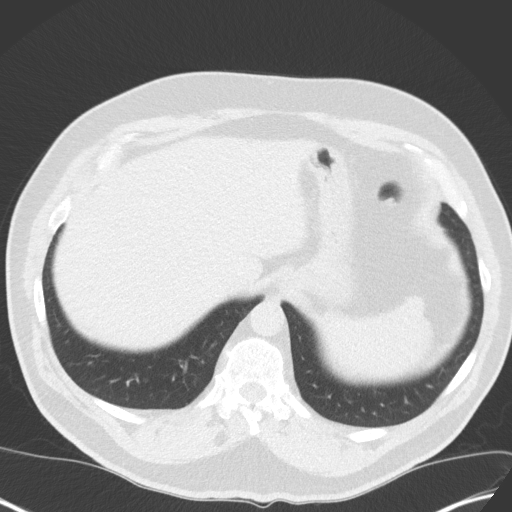
[im 20/66  lung]
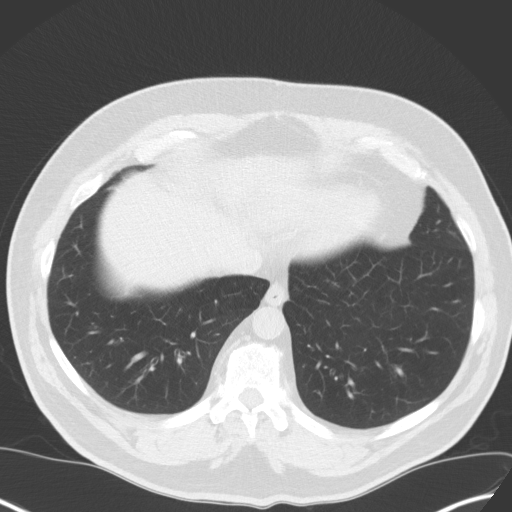
[im 25/66  mediastinal]
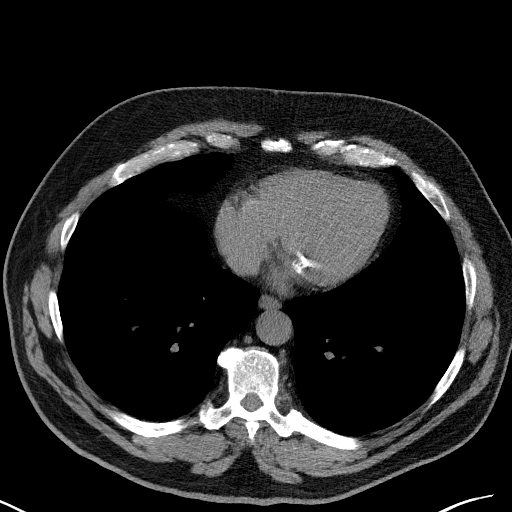
[im 25/66  lung]
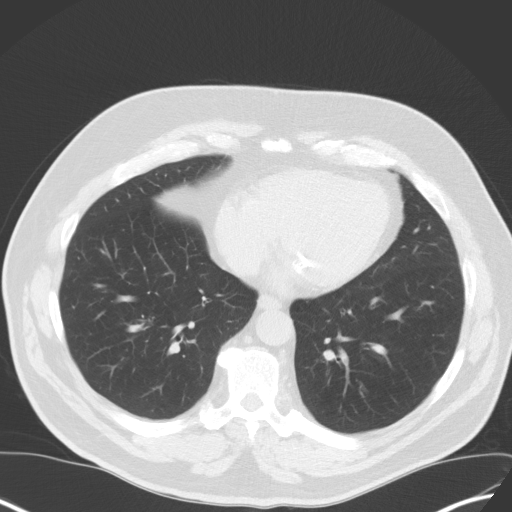
[im 29/66  lung]
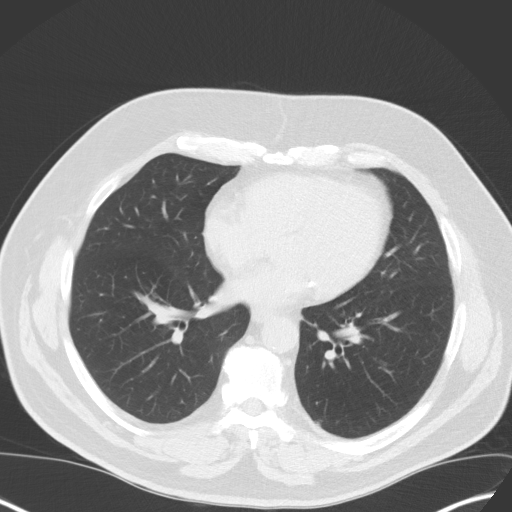
[im 37/66  lung]
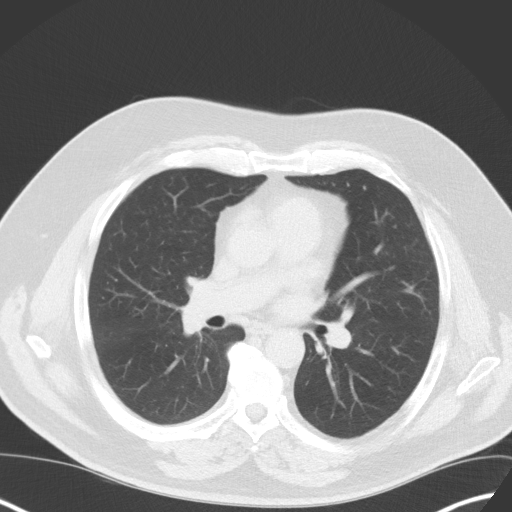
[im 41/66  lung]
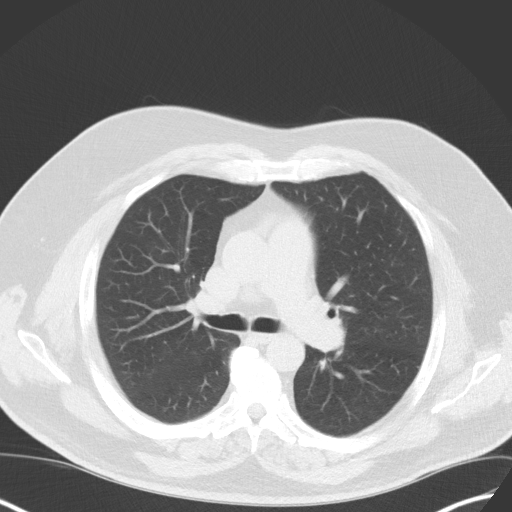
[im 46/66  mediastinal]
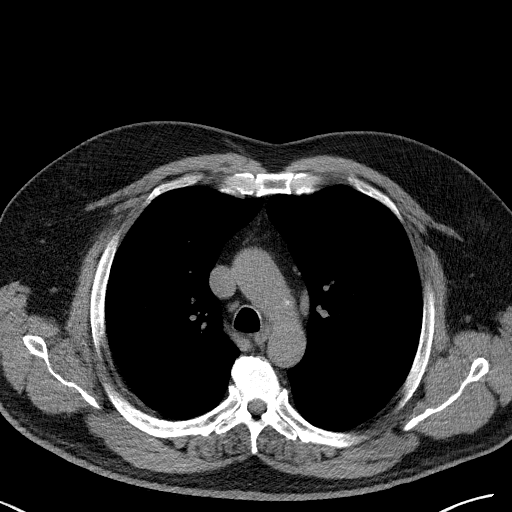
[im 46/66  lung]
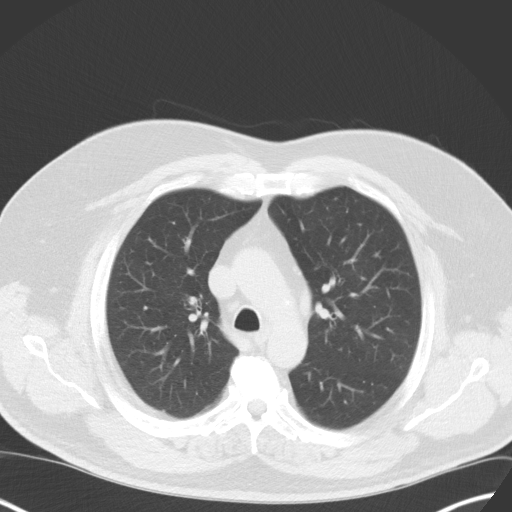
[im 51/66  lung]
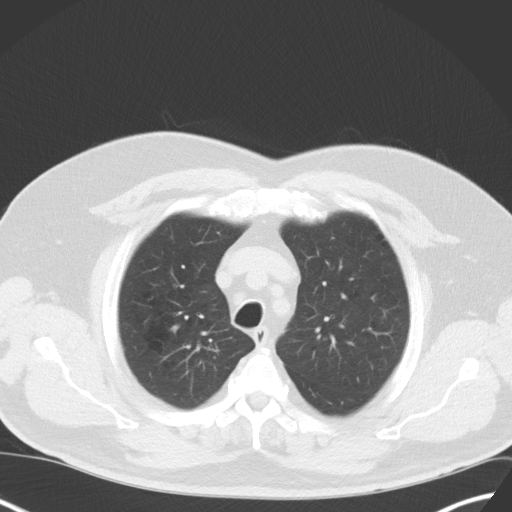
[im 56/66  lung]
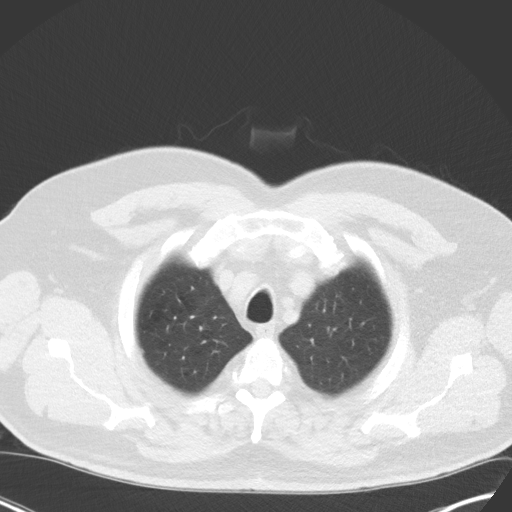
[im 61/66  lung]
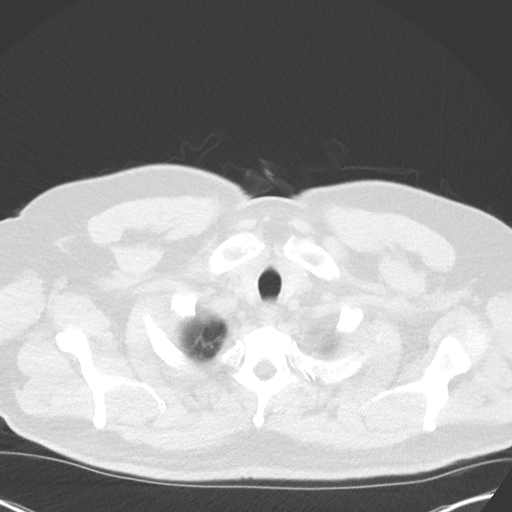

[Series 5: coronal · coronal · 0.69mm/px · 3 of 301 slices shown]
[im 61/301  lung]
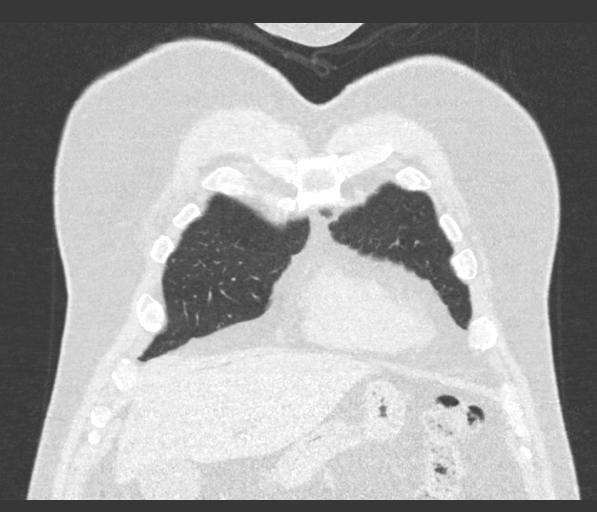
[im 121/301  lung]
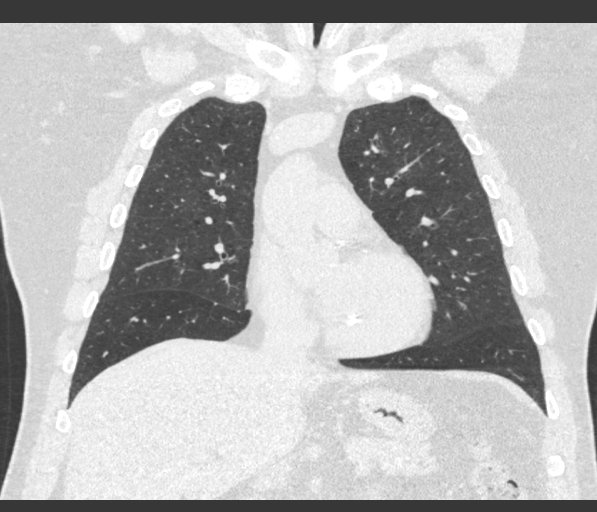
[im 181/301  lung]
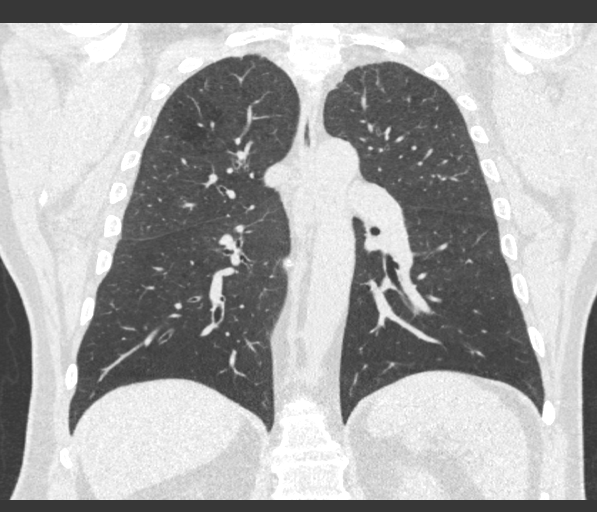

[15 of 36 positions shown; findings below may reference images not displayed]

FINDINGS: Cardiovascular: Normal heart size. Aortic atherosclerosis and
coronary artery calcifications. No pericardial effusion.

Mediastinum/Nodes: No enlarged mediastinal, hilar, or axillary lymph
nodes. Thyroid gland, trachea, and esophagus demonstrate no
significant findings.

Lungs/Pleura: Centrilobular emphysema with bronchial wall
thickening. No pleural effusion, airspace consolidation, or
atelectasis. Calcified and noncalcified lung nodules are identified.
The largest noncalcified nodule is in the left lower lobe with a
mean derived diameter of 3.1 mm.

Upper Abdomen: No acute abnormality.

Musculoskeletal: Degenerative disc disease identified within the
thoracic spine. No acute or suspicious osseous findings.
IMPRESSION: 1. Lung-RADS 2, benign appearance or behavior. Continue annual
screening with low-dose chest CT without contrast in 12 months.
2. Coronary artery calcifications noted.

Aortic Atherosclerosis (KTKC3-U30.0) and Emphysema (KTKC3-FX0.0).

## 2023-01-08 ENCOUNTER — Ambulatory Visit (INDEPENDENT_AMBULATORY_CARE_PROVIDER_SITE_OTHER): Payer: Medicare Other | Admitting: Family Medicine

## 2023-01-08 ENCOUNTER — Encounter: Payer: Self-pay | Admitting: Family Medicine

## 2023-01-08 VITALS — BP 113/72 | HR 62 | Ht 70.0 in | Wt 241.0 lb

## 2023-01-08 DIAGNOSIS — I10 Essential (primary) hypertension: Secondary | ICD-10-CM

## 2023-01-08 DIAGNOSIS — F410 Panic disorder [episodic paroxysmal anxiety] without agoraphobia: Secondary | ICD-10-CM | POA: Diagnosis not present

## 2023-01-08 DIAGNOSIS — E785 Hyperlipidemia, unspecified: Secondary | ICD-10-CM

## 2023-01-08 DIAGNOSIS — E1169 Type 2 diabetes mellitus with other specified complication: Secondary | ICD-10-CM

## 2023-01-08 DIAGNOSIS — Z86718 Personal history of other venous thrombosis and embolism: Secondary | ICD-10-CM

## 2023-01-08 DIAGNOSIS — Z7984 Long term (current) use of oral hypoglycemic drugs: Secondary | ICD-10-CM

## 2023-01-08 LAB — BAYER DCA HB A1C WAIVED: HB A1C (BAYER DCA - WAIVED): 6.6 % — ABNORMAL HIGH (ref 4.8–5.6)

## 2023-01-08 MED ORDER — SERTRALINE HCL 50 MG PO TABS: 50.0000 mg | ORAL_TABLET | Freq: Every day | ORAL | 3 refills | Status: DC

## 2023-01-08 MED ORDER — PRAVASTATIN SODIUM 20 MG PO TABS: 20.0000 mg | ORAL_TABLET | Freq: Every day | ORAL | 3 refills | Status: DC

## 2023-01-08 MED ORDER — RIVAROXABAN 15 MG PO TABS: 15.0000 mg | ORAL_TABLET | Freq: Every day | ORAL | 3 refills | Status: DC

## 2023-01-08 NOTE — Progress Notes (Signed)
BP 113/72   Pulse 62   Ht 5\' 10"  (1.778 m)   Wt 241 lb (109.3 kg)   SpO2 94%   BMI 34.58 kg/m    Subjective:   Patient ID: David Tate, male    DOB: May 21, 1956, 67 y.o.   MRN: [id number]  HPI: David Tate is a 67 y.o. male presenting on 01/08/2023 for Medical Management of Chronic Issues, Diabetes, and Hypertension   HPI Type 2 diabetes mellitus Patient comes in today for recheck of his diabetes. Patient has been currently taking Jardiance and metformin. Patient is currently on an ACE inhibitor/ARB. Patient has not seen an ophthalmologist this year. Patient denies any new issues with their feet. The symptom started onset as an adult hypertension and hyperlipidemia ARE RELATED TO DM   Hypertension Patient is currently on losartan, and their blood pressure today is 113/72. Patient denies any lightheadedness or dizziness. Patient denies headaches, blurred vision, chest pains, shortness of breath, or weakness. Denies any side effects from medication and is content with current medication.   Hyperlipidemia Patient is coming in for recheck of his hyperlipidemia. The patient is currently taking pravastatin. They deny any issues with myalgias or history of liver damage from it. They deny any focal numbness or weakness or chest pain.   DVT history Patient has a history of DVT and takes Xarelto.  Denies any issues with clots or swelling.  Patient is coming in for recheck for anxiety and depression and mood.  He is still taking the Zoloft and feels like it is helping.  A lot of his anxiety and depression stems from the fact that his son is going through cancer and having some struggles and that is hard for him as a father to watch that.  He says the medicine is helping and they are making it through it but it still is just a heart challenge to watch and see.  He denies any suicidal ideations or need for increase in the medicine at this point.  Relevant past medical, surgical, family and social  history reviewed and updated as indicated. Interim medical history since our last visit reviewed. Allergies and medications reviewed and updated.  Review of Systems  Constitutional:  Negative for chills and fever.  Eyes:  Negative for visual disturbance.  Respiratory:  Negative for shortness of breath and wheezing.   Cardiovascular:  Negative for chest pain and leg swelling.  Musculoskeletal:  Negative for back pain and gait problem.  Skin:  Negative for rash.  Neurological:  Negative for dizziness, weakness and light-headedness.  All other systems reviewed and are negative.   Per HPI unless specifically indicated above   Allergies as of 01/08/2023       Reactions   Lisinopril Cough   Latex Hives, Itching, Rash, Swelling        Medication List        Accurate as of January 08, 2023 11:20 AM. If you have any questions, ask your nurse or doctor.          empagliflozin 10 MG Tabs tablet Commonly known as: Jardiance Take 1 tablet (10 mg total) by mouth daily before breakfast.   losartan 50 MG tablet Commonly known as: COZAAR Take 2 tablets (100 mg total) by mouth daily.   metFORMIN 500 MG 24 hr tablet Commonly known as: GLUCOPHAGE-XR TAKE 2 TABLETS BY MOUTH TWICE A DAY What changed: how much to take   pravastatin 20 MG tablet Commonly known as: PRAVACHOL Take 1 tablet (20  mg total) by mouth daily.   Rivaroxaban 15 MG Tabs tablet Commonly known as: XARELTO Take 1 tablet (15 mg total) by mouth daily with supper.   sertraline 50 MG tablet Commonly known as: ZOLOFT Take 1 tablet (50 mg total) by mouth daily.   sildenafil 20 MG tablet Commonly known as: REVATIO Take 1-3 tablets (20-60 mg total) by mouth as needed.         Objective:   BP 113/72   Pulse 62   Ht 5\' 10"  (1.778 m)   Wt 241 lb (109.3 kg)   SpO2 94%   BMI 34.58 kg/m   Wt Readings from Last 3 Encounters:  01/08/23 241 lb (109.3 kg)  10/08/22 236 lb (107 kg)  06/30/22 227 lb (103 kg)     Physical Exam Vitals and nursing note reviewed.  Constitutional:      General: He is not in acute distress.    Appearance: He is well-developed. He is not diaphoretic.  Eyes:     General: No scleral icterus.    Conjunctiva/sclera: Conjunctivae normal.  Neck:     Thyroid: No thyromegaly.  Cardiovascular:     Rate and Rhythm: Normal rate and regular rhythm.     Heart sounds: Normal heart sounds. No murmur heard. Pulmonary:     Effort: Pulmonary effort is normal. No respiratory distress.     Breath sounds: Normal breath sounds. No wheezing.  Musculoskeletal:        General: No swelling. Normal range of motion.     Cervical back: Neck supple.  Lymphadenopathy:     Cervical: No cervical adenopathy.  Skin:    General: Skin is warm and dry.     Findings: No rash.  Neurological:     Mental Status: He is alert and oriented to person, place, and time.     Coordination: Coordination normal.  Psychiatric:        Behavior: Behavior normal.       Assessment & Plan:   Problem List Items Addressed This Visit       Cardiovascular and Mediastinum   Hypertension   Relevant Medications   Rivaroxaban (XARELTO) 15 MG TABS tablet   pravastatin (PRAVACHOL) 20 MG tablet     Endocrine   Type 2 diabetes mellitus with other specified complication - Primary   Relevant Medications   pravastatin (PRAVACHOL) 20 MG tablet   Other Relevant Orders   Bayer DCA Hb A1c Waived   Hyperlipidemia associated with type 2 diabetes mellitus   Relevant Medications   Rivaroxaban (XARELTO) 15 MG TABS tablet   pravastatin (PRAVACHOL) 20 MG tablet     Other   History of DVT (deep vein thrombosis)   Relevant Medications   Rivaroxaban (XARELTO) 15 MG TABS tablet   Other Visit Diagnoses     Anxiety attack       Relevant Medications   sertraline (ZOLOFT) 50 MG tablet     A1c looks good at 6.6.  No changes, continue current medicine.  Follow up plan: Return in about 3 months (around 04/09/2023), or if  symptoms worsen or fail to improve, for Diabetes hypertension and cholesterol.  Counseling provided for all of the vaccine components Orders Placed This Encounter  Procedures   Bayer [place] Hb A1c [place] Abbigael Detlefsen, MD [facility] 01/08/2023, 11:20 AM

## 2023-02-11 ENCOUNTER — Telehealth: Payer: Self-pay | Admitting: Family Medicine

## 2023-02-11 NOTE — Telephone Encounter (Signed)
Called patient to schedule Medicare Annual Wellness Visit (AWV). Left message for patient to call back and schedule Medicare Annual Wellness Visit (AWV).  Last date of AWV: 01/29/2022   Please schedule an appointment at any time with Vernona Rieger, [facility]. .  If any questions, please contact me at [phone].  Thank you,  Judeth Cornfield,  AMB Clinical Support [facility] AWV Program Direct Dial ??[phone]

## 2023-02-11 NOTE — Telephone Encounter (Signed)
Contacted Eudelia Bunch to schedule their annual wellness visit. Patient declined to schedule AWV at this time.  Do not call back - patient feels it is a waste of his time and stated that he sees his provider every three months and feels that is enough.  Thank you,  Judeth Cornfield,  AMB Clinical Support [facility] AWV Program Direct Dial ??[phone]

## 2023-04-10 ENCOUNTER — Ambulatory Visit (INDEPENDENT_AMBULATORY_CARE_PROVIDER_SITE_OTHER): Payer: Medicare Other | Admitting: Family Medicine

## 2023-04-10 ENCOUNTER — Encounter: Payer: Self-pay | Admitting: Family Medicine

## 2023-04-10 VITALS — BP 125/76 | HR 58 | Ht 70.0 in | Wt 246.0 lb

## 2023-04-10 DIAGNOSIS — E119 Type 2 diabetes mellitus without complications: Secondary | ICD-10-CM

## 2023-04-10 DIAGNOSIS — Z7984 Long term (current) use of oral hypoglycemic drugs: Secondary | ICD-10-CM

## 2023-04-10 DIAGNOSIS — E1169 Type 2 diabetes mellitus with other specified complication: Secondary | ICD-10-CM

## 2023-04-10 DIAGNOSIS — I10 Essential (primary) hypertension: Secondary | ICD-10-CM | POA: Diagnosis not present

## 2023-04-10 DIAGNOSIS — E785 Hyperlipidemia, unspecified: Secondary | ICD-10-CM

## 2023-04-10 LAB — CBC WITH DIFFERENTIAL/PLATELET
Basophils Absolute: 0.1 10*3/uL (ref 0.0–0.2)
Basos: 1 %
EOS (ABSOLUTE): 0.2 10*3/uL (ref 0.0–0.4)
Eos: 3 %
Hematocrit: 45.1 % (ref 37.5–51.0)
Hemoglobin: 15.5 g/dL (ref 13.0–17.7)
Immature Grans (Abs): 0.1 10*3/uL (ref 0.0–0.1)
Immature Granulocytes: 1 %
Lymphocytes Absolute: 2.4 10*3/uL (ref 0.7–3.1)
Lymphs: 26 %
MCH: 29 pg (ref 26.6–33.0)
MCHC: 34.4 g/dL (ref 31.5–35.7)
MCV: 84 fL (ref 79–97)
Monocytes Absolute: 0.6 10*3/uL (ref 0.1–0.9)
Monocytes: 6 %
Neutrophils Absolute: 5.9 10*3/uL (ref 1.4–7.0)
Neutrophils: 63 %
Platelets: 189 10*3/uL (ref 150–450)
RBC: 5.35 x10E6/uL (ref 4.14–5.80)
RDW: 12.1 % (ref 11.6–15.4)
WBC: 9.4 10*3/uL (ref 3.4–10.8)

## 2023-04-10 LAB — LIPID PANEL
Chol/HDL Ratio: 4.1 ratio (ref 0.0–5.0)
Cholesterol, Total: 147 mg/dL (ref 100–199)
HDL: 36 mg/dL — ABNORMAL LOW (ref >39)
LDL Chol Calc (NIH): 85 mg/dL (ref 0–99)
Triglycerides: 148 mg/dL (ref 0–149)
VLDL Cholesterol Cal: 26 mg/dL (ref 5–40)

## 2023-04-10 LAB — CMP14+EGFR
ALT: 22 IU/L (ref 0–44)
AST: 19 IU/L (ref 0–40)
Albumin: 4.3 g/dL (ref 3.9–4.9)
Alkaline Phosphatase: 121 IU/L (ref 44–121)
BUN/Creatinine Ratio: 17 (ref 10–24)
BUN: 23 mg/dL (ref 8–27)
Bilirubin Total: 0.4 mg/dL (ref 0.0–1.2)
CO2: 24 mmol/L (ref 20–29)
Calcium: 9 mg/dL (ref 8.6–10.2)
Chloride: 101 mmol/L (ref 96–106)
Creatinine, Ser: 1.32 mg/dL — ABNORMAL HIGH (ref 0.76–1.27)
Globulin, Total: 2.2 g/dL (ref 1.5–4.5)
Glucose: 168 mg/dL — ABNORMAL HIGH (ref 70–99)
Potassium: 4.6 mmol/L (ref 3.5–5.2)
Sodium: 139 mmol/L (ref 134–144)
Total Protein: 6.5 g/dL (ref 6.0–8.5)
eGFR: 58 mL/min/{1.73_m2} — ABNORMAL LOW (ref >59)

## 2023-04-10 LAB — BAYER DCA HB A1C WAIVED: HB A1C (BAYER DCA - WAIVED): 6.7 % — ABNORMAL HIGH (ref 4.8–5.6)

## 2023-04-10 NOTE — Progress Notes (Signed)
BP 125/76   Pulse (!) 58   Ht 5\' 10"  (1.778 m)   Wt 246 lb (111.6 kg)   SpO2 94%   BMI 35.30 kg/m    Subjective:   Patient ID: David Tate, male    DOB: [birth year], 67 y.o.   MRN: [id number]  HPI: David Tate is a 67 y.o. male presenting on 04/10/2023 for Medical Management of Chronic Issues, Diabetes, and Hypertension   HPI Type 2 diabetes mellitus Patient comes in today for recheck of his diabetes. Patient has been currently taking Jardiance and metformin. Patient is currently on an ACE inhibitor/ARB. Patient has not seen an ophthalmologist this year. Patient denies any new issues with their feet. The symptom started onset as an adult hypertension and hyperlipidemia ARE RELATED TO DM   Hypertension Patient is currently on losartan, and their blood pressure today is 125/76. Patient denies any lightheadedness or dizziness. Patient denies headaches, blurred vision, chest pains, shortness of breath, or weakness. Denies any side effects from medication and is content with current medication.   Hyperlipidemia Patient is coming in for recheck of his hyperlipidemia. The patient is currently taking pravastatin. They deny any issues with myalgias or history of liver damage from it. They deny any focal numbness or weakness or chest pain.   Relevant past medical, surgical, family and social history reviewed and updated as indicated. Interim medical history since our last visit reviewed. Allergies and medications reviewed and updated.  Review of Systems  Constitutional:  Negative for chills and fever.  Eyes:  Negative for visual disturbance.  Respiratory:  Negative for shortness of breath and wheezing.   Cardiovascular:  Negative for chest pain and leg swelling.  Musculoskeletal:  Negative for back pain and gait problem.  Skin:  Negative for rash.  Neurological:  Negative for dizziness, weakness and light-headedness.  All other systems reviewed and are negative.   Per HPI unless  specifically indicated above   Allergies as of 04/10/2023       Reactions   Lisinopril Cough   Latex Hives, Itching, Rash, Swelling        Medication List        Accurate as of April 10, 2023 11:50 AM. If you have any questions, ask your nurse or doctor.          empagliflozin 10 MG Tabs tablet Commonly known as: Jardiance Take 1 tablet (10 mg total) by mouth daily before breakfast.   losartan 50 MG tablet Commonly known as: COZAAR Take 2 tablets (100 mg total) by mouth daily.   metFORMIN 500 MG 24 hr tablet Commonly known as: GLUCOPHAGE-XR TAKE 2 TABLETS BY MOUTH TWICE A DAY What changed: how much to take   pravastatin 20 MG tablet Commonly known as: PRAVACHOL Take 1 tablet (20 mg total) by mouth daily.   Rivaroxaban 15 MG Tabs tablet Commonly known as: XARELTO Take 1 tablet (15 mg total) by mouth daily with supper.   sertraline 50 MG tablet Commonly known as: ZOLOFT Take 1 tablet (50 mg total) by mouth daily.   sildenafil 20 MG tablet Commonly known as: REVATIO Take 1-3 tablets (20-60 mg total) by mouth as needed.         Objective:   BP 125/76   Pulse (!) 58   Ht 5\' 10"  (1.778 m)   Wt 246 lb (111.6 kg)   SpO2 94%   BMI 35.30 kg/m   Wt Readings from Last 3 Encounters:  04/10/23 246 lb (111.6 kg)  01/08/23 241 lb (109.3 kg)  10/08/22 236 lb (107 kg)    Physical Exam Vitals and nursing note reviewed.  Constitutional:      General: He is not in acute distress.    Appearance: He is well-developed. He is not diaphoretic.  Eyes:     General: No scleral icterus.    Conjunctiva/sclera: Conjunctivae normal.  Neck:     Thyroid: No thyromegaly.  Cardiovascular:     Rate and Rhythm: Normal rate and regular rhythm.     Heart sounds: Normal heart sounds. No murmur heard. Pulmonary:     Effort: Pulmonary effort is normal. No respiratory distress.     Breath sounds: Normal breath sounds. No wheezing.  Musculoskeletal:        General: No swelling.  Normal range of motion.     Cervical back: Neck supple.  Lymphadenopathy:     Cervical: No cervical adenopathy.  Skin:    General: Skin is warm and dry.     Findings: No rash.  Neurological:     Mental Status: He is alert and oriented to person, place, and time.     Coordination: Coordination normal.  Psychiatric:        Behavior: Behavior normal.       Assessment & Plan:   Problem List Items Addressed This Visit       Cardiovascular and Mediastinum   Hypertension   Relevant Orders   CMP14+EGFR   Lipid panel   CBC with Differential/Platelet   Bayer DCA Hb A1c Waived     Endocrine   Type 2 diabetes mellitus with other specified complication (HCC) - Primary   Relevant Orders   CMP14+EGFR   Lipid panel   CBC with Differential/Platelet   Bayer DCA Hb A1c Waived   Hyperlipidemia associated with type 2 diabetes mellitus (HCC)   Relevant Orders   CMP14+EGFR   Lipid panel   CBC with Differential/Platelet   Bayer DCA Hb A1c Waived   Other Visit Diagnoses     Diabetes mellitus treated with oral medication (HCC)         A1c 6.7, essentially same as it has been.  No changes.  Continue with diet and continue current medicine.  Follow up plan: Return in about 3 months (around 07/11/2023), or if symptoms worsen or fail to improve, for Diabetes hypertension and cholesterol.  Counseling provided for all of the vaccine components Orders Placed This Encounter  Procedures   CMP14+EGFR   Lipid panel   CBC with Differential/Platelet   Bayer DCA Hb A1c Waived    Arville Care, MD [facility] 04/10/2023, 11:50 AM

## 2023-06-30 ENCOUNTER — Ambulatory Visit (INDEPENDENT_AMBULATORY_CARE_PROVIDER_SITE_OTHER): Payer: Medicare Other | Admitting: *Deleted

## 2023-06-30 DIAGNOSIS — E1169 Type 2 diabetes mellitus with other specified complication: Secondary | ICD-10-CM | POA: Diagnosis not present

## 2023-06-30 DIAGNOSIS — E119 Type 2 diabetes mellitus without complications: Secondary | ICD-10-CM

## 2023-06-30 DIAGNOSIS — Z7984 Long term (current) use of oral hypoglycemic drugs: Secondary | ICD-10-CM | POA: Diagnosis not present

## 2023-06-30 LAB — HM DIABETES EYE EXAM

## 2023-06-30 NOTE — Progress Notes (Signed)
David Tate arrived 06/30/2023 and has given verbal consent to obtain images and complete their overdue diabetic retinal screening.  The images have been sent to an ophthalmologist or optometrist for review and interpretation.  Results will be sent back to Dettinger, Elige Radon, MD for review.  Patient has been informed they will be contacted when we receive the results via telephone or MyChart

## 2023-07-15 ENCOUNTER — Ambulatory Visit: Payer: Medicare Other | Admitting: Family Medicine

## 2023-08-20 ENCOUNTER — Encounter: Payer: Self-pay | Admitting: Family Medicine

## 2023-08-20 ENCOUNTER — Ambulatory Visit: Payer: Medicare Other | Admitting: Family Medicine

## 2023-08-20 VITALS — Ht 70.0 in

## 2023-08-20 DIAGNOSIS — I10 Essential (primary) hypertension: Secondary | ICD-10-CM

## 2023-08-20 DIAGNOSIS — E785 Hyperlipidemia, unspecified: Secondary | ICD-10-CM | POA: Diagnosis not present

## 2023-08-20 DIAGNOSIS — E1169 Type 2 diabetes mellitus with other specified complication: Secondary | ICD-10-CM | POA: Diagnosis not present

## 2023-08-20 DIAGNOSIS — Z7984 Long term (current) use of oral hypoglycemic drugs: Secondary | ICD-10-CM

## 2023-08-20 DIAGNOSIS — Z23 Encounter for immunization: Secondary | ICD-10-CM

## 2023-08-20 LAB — BAYER DCA HB A1C WAIVED: HB A1C (BAYER DCA - WAIVED): 7.1 % — ABNORMAL HIGH (ref 4.8–5.6)

## 2023-08-20 MED ORDER — METFORMIN HCL ER 500 MG PO TB24: 500.0000 mg | ORAL_TABLET | Freq: Two times a day (BID) | ORAL | 3 refills | Status: DC

## 2023-08-20 NOTE — Progress Notes (Signed)
Ht 5\' 10"  (1.778 m)   BMI 35.30 kg/m 113/59  Subjective:   Patient ID: David Tate, male    DOB: 04-28-1956, 67 y.o.   MRN: [id number]  HPI: David Tate is a 67 y.o. male presenting on 08/20/2023 for Medical Management of Chronic Issues, Diabetes, and Hypertension  Type 2 diabetes mellitus Patient comes in today for recheck of his diabetes. Patient is currently taking Jardiance and metformin. Patient does not monitor his blood glucose at home. He denies any hypoglycemic episodes. Patient has seen an ophthalmologist this year. Patient denies any new issues with his feet. His diabetes is complicated by hypertension and hyperlipidemia.  Hypertension Patient is currently taking losartan. His blood pressure today is 113/59. He denies lightheadedness or dizziness, headaches, vision changes, chest pain, or shortness of breath.    Hyperlipidemia Patient is currently taking atorvastatin. He denies myalgias or weakness. He does not have a history of liver damage from it.   Patient had COVID around one month ago, so he had to reschedule his 52-month appointment to now. He feels like he has made a full recovery with no lingering symptoms.  Relevant past medical, surgical, family and social history reviewed and updated as indicated. Interim medical history since our last visit reviewed. Allergies and medications reviewed and updated.  Review of Systems  Constitutional:  Negative for chills, fatigue and fever.  HENT:  Negative for congestion, sinus pressure and sore throat.   Eyes:  Negative for visual disturbance.  Respiratory:  Negative for chest tightness and shortness of breath.   Cardiovascular:  Negative for chest pain, palpitations and leg swelling.  Gastrointestinal:  Negative for abdominal pain, constipation and diarrhea.  Genitourinary:  Negative for decreased urine volume and difficulty urinating.  Musculoskeletal:  Negative for myalgias.  Neurological:  Negative for dizziness,  syncope, weakness, light-headedness and headaches.    Per HPI unless specifically indicated above   Allergies as of 08/20/2023       Reactions   Lisinopril Cough   Latex Hives, Itching, Rash, Swelling        Medication List        Accurate as of August 20, 2023 11:59 PM. If you have any questions, ask your nurse or doctor.          empagliflozin 10 MG Tabs tablet Commonly known as: Jardiance Take 1 tablet (10 mg total) by mouth daily before breakfast.   losartan 50 MG tablet Commonly known as: COZAAR Take 2 tablets (100 mg total) by mouth daily.   metFORMIN 500 MG 24 hr tablet Commonly known as: GLUCOPHAGE-XR Take 1 tablet (500 mg total) by mouth 2 (two) times daily.   pravastatin 20 MG tablet Commonly known as: PRAVACHOL Take 1 tablet (20 mg total) by mouth daily.   Rivaroxaban 15 MG Tabs tablet Commonly known as: XARELTO Take 1 tablet (15 mg total) by mouth daily with supper.   sertraline 50 MG tablet Commonly known as: ZOLOFT Take 1 tablet (50 mg total) by mouth daily.   sildenafil 20 MG tablet Commonly known as: REVATIO Take 1-3 tablets (20-60 mg total) by mouth as needed.         Objective:   Ht 5\' 10"  (1.778 m)   BMI 35.30 kg/m   Wt Readings from Last 3 Encounters:  04/10/23 246 lb (111.6 kg)  01/08/23 241 lb (109.3 kg)  10/08/22 236 lb (107 kg)    Physical Exam Vitals and nursing note reviewed.  Constitutional:  Appearance: Normal appearance. He is obese.  HENT:     Head: Normocephalic and atraumatic.     Right Ear: External ear normal.     Left Ear: External ear normal.  Eyes:     Conjunctiva/sclera: Conjunctivae normal.  Cardiovascular:     Rate and Rhythm: Normal rate and regular rhythm.     Heart sounds: Normal heart sounds.  Pulmonary:     Effort: Pulmonary effort is normal.     Breath sounds: Normal breath sounds. No wheezing.  Abdominal:     General: Abdomen is flat. There is no distension.     Palpations:  Abdomen is soft.     Tenderness: There is no abdominal tenderness.  Musculoskeletal:     Cervical back: Normal range of motion and neck supple.     Right lower leg: No edema.     Left lower leg: No edema.  Skin:    General: Skin is warm and dry.  Neurological:     Mental Status: He is alert and oriented to person, place, and time.  Psychiatric:        Mood and Affect: Mood normal.        Behavior: Behavior normal.        Thought Content: Thought content normal.        Judgment: Judgment normal.    Results for orders placed or performed in visit on 08/20/23  CBC with Differential/Platelet  Result Value Ref Range   WBC 9.5 3.4 - 10.8 x10E3/uL   RBC 5.65 4.14 - 5.80 x10E6/uL   Hemoglobin 15.8 13.0 - 17.7 g/dL   Hematocrit 08.6 57.8 - 51.0 %   MCV 90 79 - 97 fL   MCH 28.0 26.6 - 33.0 pg   MCHC 31.2 (L) 31.5 - 35.7 g/dL   RDW 46.9 62.9 - 52.8 %   Platelets 180 150 - 450 x10E3/uL   Neutrophils 62 Not Estab. %   Lymphs 28 Not Estab. %   Monocytes 6 Not Estab. %   Eos 2 Not Estab. %   Basos 1 Not Estab. %   Neutrophils Absolute 5.9 1.4 - 7.0 x10E3/uL   Lymphocytes Absolute 2.7 0.7 - 3.1 x10E3/uL   Monocytes Absolute 0.6 0.1 - 0.9 x10E3/uL   EOS (ABSOLUTE) 0.2 0.0 - 0.4 x10E3/uL   Basophils Absolute 0.1 0.0 - 0.2 x10E3/uL   Immature Granulocytes 1 Not Estab. %   Immature Grans (Abs) 0.1 0.0 - 0.1 x10E3/uL  CMP14+EGFR  Result Value Ref Range   Glucose 234 (H) 70 - 99 mg/dL   BUN 24 8 - 27 mg/dL   Creatinine, Ser 4.13 0.76 - 1.27 mg/dL   eGFR 70 >24 MW/NUU/7.25   BUN/Creatinine Ratio 21 10 - 24   Sodium 140 134 - 144 mmol/L   Potassium 4.4 3.5 - 5.2 mmol/L   Chloride 102 96 - 106 mmol/L   CO2 21 20 - 29 mmol/L   Calcium 9.3 8.6 - 10.2 mg/dL   Total Protein 6.8 6.0 - 8.5 g/dL   Albumin 4.3 3.9 - 4.9 g/dL   Globulin, Total 2.5 1.5 - 4.5 g/dL   Bilirubin Total 0.5 0.0 - 1.2 mg/dL   Alkaline Phosphatase 125 (H) 44 - 121 IU/L   AST 20 0 - 40 IU/L   ALT 22 0 - 44 IU/L   Lipid panel  Result Value Ref Range   Cholesterol, Total 160 100 - 199 mg/dL   Triglycerides 366 (H) 0 - 149 mg/dL   HDL  36 (L) >39 mg/dL   VLDL Cholesterol Cal 37 5 - 40 mg/dL   LDL Chol Calc (NIH) 87 0 - 99 mg/dL   Chol/HDL Ratio 4.4 0.0 - 5.0 ratio  Bayer DCA Hb A1c Waived  Result Value Ref Range   HB A1C (BAYER DCA - WAIVED) 7.1 (H) 4.8 - 5.6 %    Assessment & Plan:   Problem List Items Addressed This Visit       Cardiovascular and Mediastinum   Hypertension   Relevant Orders   CBC with Differential/Platelet (Completed)   CMP14+EGFR (Completed)   Lipid panel (Completed)   Bayer DCA Hb A1c Waived (Completed)     Endocrine   Type 2 diabetes mellitus with other specified complication (HCC) - Primary   Relevant Medications   metFORMIN (GLUCOPHAGE-XR) 500 MG 24 hr tablet   Other Relevant Orders   CBC with Differential/Platelet (Completed)   CMP14+EGFR (Completed)   Lipid panel (Completed)   Bayer DCA Hb A1c Waived (Completed)   Hyperlipidemia associated with type 2 diabetes mellitus (HCC)   Relevant Medications   metFORMIN (GLUCOPHAGE-XR) 500 MG 24 hr tablet   Other Relevant Orders   CBC with Differential/Platelet (Completed)   CMP14+EGFR (Completed)   Lipid panel (Completed)   Bayer DCA Hb A1c Waived (Completed)   Other Visit Diagnoses     Encounter for immunization       Relevant Orders   Flu Vaccine Trivalent High Dose (Fluad) (Completed)      Patient is doing well with no complaints. Blood pressure well-controlled at 113/59. HgbA1c 7.1%. Encouraged patient to eat carbohydrate rich foods like bread, potatoes, rice, and desserts in moderation as well as increase activity level. Patient tolerating statin well. Will recheck lipid panel today. No medication changes at this time.  Follow up plan: Return in about 3 months (around 11/20/2023), or if symptoms worsen or fail to improve, for Diabetes and hypertension and cholesterol check.  Counseling provided for  all of the vaccine components Orders Placed This Encounter  Procedures   Flu Vaccine Trivalent High Dose (Fluad)   CBC with Differential/Platelet   CMP14+EGFR   Lipid panel   Bayer DCA Hb A1c Waived   Gillermina Phy, Medical Student [facility] 08/27/2023, 10:28 AM  Patient seen and examined with Gillermina Phy, medical student, agree with assessment and plan above.  A1c was up slightly at 7.1, encouraged dietary changes and exercise and lifestyle modification.  Blood pressure looks good.  Follow-up in 3 months Arville Care, MD [facility] 08/27/2023, 10:29 AM

## 2023-08-21 LAB — LIPID PANEL
Chol/HDL Ratio: 4.4 ratio (ref 0.0–5.0)
Cholesterol, Total: 160 mg/dL (ref 100–199)
HDL: 36 mg/dL — ABNORMAL LOW (ref >39)
LDL Chol Calc (NIH): 87 mg/dL (ref 0–99)
Triglycerides: 220 mg/dL — ABNORMAL HIGH (ref 0–149)
VLDL Cholesterol Cal: 37 mg/dL (ref 5–40)

## 2023-08-21 LAB — CMP14+EGFR
ALT: 22 [IU]/L (ref 0–44)
AST: 20 [IU]/L (ref 0–40)
Albumin: 4.3 g/dL (ref 3.9–4.9)
Alkaline Phosphatase: 125 [IU]/L — ABNORMAL HIGH (ref 44–121)
BUN/Creatinine Ratio: 21 (ref 10–24)
BUN: 24 mg/dL (ref 8–27)
Bilirubin Total: 0.5 mg/dL (ref 0.0–1.2)
CO2: 21 mmol/L (ref 20–29)
Calcium: 9.3 mg/dL (ref 8.6–10.2)
Chloride: 102 mmol/L (ref 96–106)
Creatinine, Ser: 1.13 mg/dL (ref 0.76–1.27)
Globulin, Total: 2.5 g/dL (ref 1.5–4.5)
Glucose: 234 mg/dL — ABNORMAL HIGH (ref 70–99)
Potassium: 4.4 mmol/L (ref 3.5–5.2)
Sodium: 140 mmol/L (ref 134–144)
Total Protein: 6.8 g/dL (ref 6.0–8.5)
eGFR: 70 mL/min/{1.73_m2} (ref >59)

## 2023-08-21 LAB — CBC WITH DIFFERENTIAL/PLATELET
Basophils Absolute: 0.1 10*3/uL (ref 0.0–0.2)
Basos: 1 %
EOS (ABSOLUTE): 0.2 10*3/uL (ref 0.0–0.4)
Eos: 2 %
Hematocrit: 50.7 % (ref 37.5–51.0)
Hemoglobin: 15.8 g/dL (ref 13.0–17.7)
Immature Grans (Abs): 0.1 10*3/uL (ref 0.0–0.1)
Immature Granulocytes: 1 %
Lymphocytes Absolute: 2.7 10*3/uL (ref 0.7–3.1)
Lymphs: 28 %
MCH: 28 pg (ref 26.6–33.0)
MCHC: 31.2 g/dL — ABNORMAL LOW (ref 31.5–35.7)
MCV: 90 fL (ref 79–97)
Monocytes Absolute: 0.6 10*3/uL (ref 0.1–0.9)
Monocytes: 6 %
Neutrophils Absolute: 5.9 10*3/uL (ref 1.4–7.0)
Neutrophils: 62 %
Platelets: 180 10*3/uL (ref 150–450)
RBC: 5.65 x10E6/uL (ref 4.14–5.80)
RDW: 12.2 % (ref 11.6–15.4)
WBC: 9.5 10*3/uL (ref 3.4–10.8)

## 2023-09-24 ENCOUNTER — Other Ambulatory Visit: Payer: Self-pay | Admitting: Family Medicine

## 2023-10-19 NOTE — Progress Notes (Signed)
 Attempted to contact patient regarding follow-up LDCT. Unable to contact patient directly. Detailed VM left asking that the patient return my call

## 2023-10-31 ENCOUNTER — Other Ambulatory Visit: Payer: Self-pay | Admitting: Family Medicine

## 2023-10-31 DIAGNOSIS — E1169 Type 2 diabetes mellitus with other specified complication: Secondary | ICD-10-CM

## 2023-10-31 DIAGNOSIS — I10 Essential (primary) hypertension: Secondary | ICD-10-CM

## 2023-10-31 DIAGNOSIS — Z86718 Personal history of other venous thrombosis and embolism: Secondary | ICD-10-CM

## 2023-11-09 NOTE — Progress Notes (Signed)
 Attempted to reach patient regarding follow-up LDCT. Unable to reach patient directly. Detailed VM left asking that the patient return my call.

## 2023-11-23 ENCOUNTER — Ambulatory Visit: Payer: Medicare Other | Admitting: Family Medicine

## 2023-11-23 ENCOUNTER — Encounter: Payer: Self-pay | Admitting: Family Medicine

## 2023-11-23 ENCOUNTER — Encounter: Payer: Self-pay | Admitting: Emergency Medicine

## 2023-11-23 VITALS — BP 125/76 | HR 62 | Ht 70.0 in | Wt 247.0 lb

## 2023-11-23 DIAGNOSIS — I10 Essential (primary) hypertension: Secondary | ICD-10-CM | POA: Diagnosis not present

## 2023-11-23 DIAGNOSIS — E1169 Type 2 diabetes mellitus with other specified complication: Secondary | ICD-10-CM

## 2023-11-23 DIAGNOSIS — Z122 Encounter for screening for malignant neoplasm of respiratory organs: Secondary | ICD-10-CM

## 2023-11-23 DIAGNOSIS — Z7984 Long term (current) use of oral hypoglycemic drugs: Secondary | ICD-10-CM

## 2023-11-23 DIAGNOSIS — E785 Hyperlipidemia, unspecified: Secondary | ICD-10-CM | POA: Diagnosis not present

## 2023-11-23 DIAGNOSIS — Z86718 Personal history of other venous thrombosis and embolism: Secondary | ICD-10-CM | POA: Diagnosis not present

## 2023-11-23 DIAGNOSIS — F410 Panic disorder [episodic paroxysmal anxiety] without agoraphobia: Secondary | ICD-10-CM | POA: Diagnosis not present

## 2023-11-23 DIAGNOSIS — I152 Hypertension secondary to endocrine disorders: Secondary | ICD-10-CM

## 2023-11-23 DIAGNOSIS — E1159 Type 2 diabetes mellitus with other circulatory complications: Secondary | ICD-10-CM | POA: Diagnosis not present

## 2023-11-23 LAB — BAYER DCA HB A1C WAIVED: HB A1C (BAYER DCA - WAIVED): 7 % — ABNORMAL HIGH (ref 4.8–5.6)

## 2023-11-23 MED ORDER — PRAVASTATIN SODIUM 20 MG PO TABS: 20.0000 mg | ORAL_TABLET | Freq: Every day | ORAL | 3 refills | Status: DC

## 2023-11-23 MED ORDER — LOSARTAN POTASSIUM 50 MG PO TABS: 100.0000 mg | ORAL_TABLET | Freq: Every day | ORAL | 3 refills | Status: DC

## 2023-11-23 MED ORDER — METFORMIN HCL ER 500 MG PO TB24: 1000.0000 mg | ORAL_TABLET | Freq: Two times a day (BID) | ORAL | 3 refills | Status: DC

## 2023-11-23 MED ORDER — EMPAGLIFLOZIN 10 MG PO TABS: 10.0000 mg | ORAL_TABLET | Freq: Every day | ORAL | 3 refills | Status: DC

## 2023-11-23 MED ORDER — SERTRALINE HCL 50 MG PO TABS: 50.0000 mg | ORAL_TABLET | Freq: Every day | ORAL | 3 refills | Status: DC

## 2023-11-23 MED ORDER — RIVAROXABAN 15 MG PO TABS: 15.0000 mg | ORAL_TABLET | Freq: Every day | ORAL | 3 refills | Status: DC

## 2023-11-23 NOTE — Progress Notes (Signed)
BP 125/76   Pulse 62   Ht 5\' 10"  (1.778 m)   Wt 247 lb (112 kg)   SpO2 93%   BMI 35.44 kg/m    Subjective:   Patient ID: David Tate, male    DOB: Jul 09, 1956, 68 y.o.   MRN: [id number]  HPI: David Tate is a 68 y.o. male presenting on 11/23/2023 for Medical Management of Chronic Issues, Diabetes, and Hypertension   HPI Type 2 diabetes mellitus Patient comes in today for recheck of his diabetes. Patient has been currently taking metformin and Jardiance. Patient is currently on an ACE inhibitor/ARB. Patient has not seen an ophthalmologist this year. Patient denies any new issues with their feet. The symptom started onset as an adult hypertension and hyperlipidemia ARE RELATED TO DM   Hyperlipidemia Patient is coming in for recheck of his hyperlipidemia. The patient is currently taking pravastatin. They deny any issues with myalgias or history of liver damage from it. They deny any focal numbness or weakness or chest pain.   Hypertension Patient is currently on losartan, and their blood pressure today is 125/76. Patient denies any lightheadedness or dizziness. Patient denies headaches, blurred vision, chest pains, shortness of breath, or weakness. Denies any side effects from medication and is content with current medication.   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  He denies any major bruising or bleeding.  Relevant past medical, surgical, family and social history reviewed and updated as indicated. Interim medical history since our last visit reviewed. Allergies and medications reviewed and updated.  Review of Systems  Constitutional:  Negative for chills and fever.  Eyes:  Negative for discharge.  Respiratory:  Negative for shortness of breath and wheezing.   Cardiovascular:  Negative for chest pain and leg swelling.  Musculoskeletal:  Negative for back pain and gait problem.  Skin:  Negative for rash.  All other systems reviewed and are  negative.   Per HPI unless specifically indicated above   Allergies as of 11/23/2023       Reactions   Lisinopril Cough   Latex Hives, Itching, Rash, Swelling        Medication List        Accurate as of November 23, 2023 11:22 AM. If you have any questions, ask your nurse or doctor.          empagliflozin 10 MG Tabs tablet Commonly known as: Jardiance Take 1 tablet (10 mg total) by mouth daily before breakfast.   losartan 50 MG tablet Commonly known as: COZAAR Take 2 tablets (100 mg total) by mouth daily.   metFORMIN 500 MG 24 hr tablet Commonly known as: GLUCOPHAGE-XR Take 2 tablets (1,000 mg total) by mouth 2 (two) times daily.   pravastatin 20 MG tablet Commonly known as: PRAVACHOL Take 1 tablet (20 mg total) by mouth daily.   Rivaroxaban 15 MG Tabs tablet Commonly known as: Xarelto Take 1 tablet (15 mg total) by mouth daily with supper. What changed: See the new instructions. Changed by: Elige Radon Maxfield Gildersleeve   sertraline 50 MG tablet Commonly known as: ZOLOFT Take 1 tablet (50 mg total) by mouth daily.   sildenafil 20 MG tablet Commonly known as: REVATIO Take 1-3 tablets (20-60 mg total) by mouth as needed.         Objective:   BP 125/76   Pulse 62   Ht 5\' 10"  (1.778 m)   Wt 247 lb (112 kg)   SpO2 93%  BMI 35.44 kg/m   Wt Readings from Last 3 Encounters:  11/23/23 247 lb (112 kg)  04/10/23 246 lb (111.6 kg)  01/08/23 241 lb (109.3 kg)    Physical Exam Vitals and nursing note reviewed.  Constitutional:      General: He is not in acute distress.    Appearance: He is well-developed. He is not diaphoretic.  Eyes:     General: No scleral icterus.    Conjunctiva/sclera: Conjunctivae normal.  Neck:     Thyroid: No thyromegaly.  Cardiovascular:     Rate and Rhythm: Normal rate and regular rhythm.     Heart sounds: Normal heart sounds. No murmur heard. Pulmonary:     Effort: Pulmonary effort is normal. No respiratory distress.      Breath sounds: Normal breath sounds. No wheezing.  Musculoskeletal:        General: Normal range of motion.     Cervical back: Neck supple.  Lymphadenopathy:     Cervical: No cervical adenopathy.  Skin:    General: Skin is warm and dry.     Findings: No rash.  Neurological:     Mental Status: He is alert and oriented to person, place, and time.     Coordination: Coordination normal.  Psychiatric:        Behavior: Behavior normal.       Assessment & Plan:   Problem List Items Addressed This Visit       Cardiovascular and Mediastinum   Hypertension   Relevant Medications   losartan (COZAAR) 50 MG tablet   pravastatin (PRAVACHOL) 20 MG tablet   Rivaroxaban (XARELTO) 15 MG TABS tablet   Other Relevant Orders   CBC with Differential/Platelet   CMP14+EGFR   Lipid panel   Bayer DCA Hb A1c Waived     Endocrine   Type 2 diabetes mellitus with other specified complication (HCC) - Primary   Relevant Medications   empagliflozin (JARDIANCE) 10 MG TABS tablet   losartan (COZAAR) 50 MG tablet   metFORMIN (GLUCOPHAGE-XR) 500 MG 24 hr tablet   pravastatin (PRAVACHOL) 20 MG tablet   Other Relevant Orders   CBC with Differential/Platelet   CMP14+EGFR   Lipid panel   Bayer DCA Hb A1c Waived   Microalbumin / creatinine urine ratio   Hyperlipidemia associated with type 2 diabetes mellitus (HCC)   Relevant Medications   empagliflozin (JARDIANCE) 10 MG TABS tablet   losartan (COZAAR) 50 MG tablet   metFORMIN (GLUCOPHAGE-XR) 500 MG 24 hr tablet   pravastatin (PRAVACHOL) 20 MG tablet   Rivaroxaban (XARELTO) 15 MG TABS tablet   Other Relevant Orders   CBC with Differential/Platelet   CMP14+EGFR   Lipid panel   Bayer DCA Hb A1c Waived     Other   History of DVT (deep vein thrombosis)   Relevant Medications   Rivaroxaban (XARELTO) 15 MG TABS tablet   Other Visit Diagnoses       Anxiety attack       Relevant Medications   sertraline (ZOLOFT) 50 MG tablet     Screening for  lung cancer       Relevant Orders   Ambulatory Referral Lung Cancer Screening [facility]       A1c looks good at 7.0, he is going to continue to try improved with diet but it is slightly better than last time.  Blood pressure looks good today. Seems to be doing well on all his other medicines, no changes today except we will order the lung  cancer screening that he gets yearly. Follow up plan: Return in about 3 months (around 02/20/2024), or if symptoms worsen or fail to improve, for Diabetes recheck.  Counseling provided for all of the vaccine components Orders Placed This Encounter  Procedures   CBC with Differential/Platelet   CMP14+EGFR   Lipid panel   Bayer DCA Hb A1c Waived   Microalbumin / creatinine urine ratio   Ambulatory Referral Lung Cancer Screening  Pulmonary    Arville Care, MD [facility] 11/23/2023, 11:22 AM

## 2023-11-24 LAB — CBC WITH DIFFERENTIAL/PLATELET
Basophils Absolute: 0.1 10*3/uL (ref 0.0–0.2)
Basos: 1 %
EOS (ABSOLUTE): 0.2 10*3/uL (ref 0.0–0.4)
Eos: 2 %
Hematocrit: 49.3 % (ref 37.5–51.0)
Hemoglobin: 16.2 g/dL (ref 13.0–17.7)
Immature Grans (Abs): 0.1 10*3/uL (ref 0.0–0.1)
Immature Granulocytes: 1 %
Lymphocytes Absolute: 2.4 10*3/uL (ref 0.7–3.1)
Lymphs: 25 %
MCH: 28.8 pg (ref 26.6–33.0)
MCHC: 32.9 g/dL (ref 31.5–35.7)
MCV: 88 fL (ref 79–97)
Monocytes Absolute: 0.7 10*3/uL (ref 0.1–0.9)
Monocytes: 7 %
Neutrophils Absolute: 6.1 10*3/uL (ref 1.4–7.0)
Neutrophils: 64 %
Platelets: 190 10*3/uL (ref 150–450)
RBC: 5.63 x10E6/uL (ref 4.14–5.80)
RDW: 12.5 % (ref 11.6–15.4)
WBC: 9.5 10*3/uL (ref 3.4–10.8)

## 2023-11-24 LAB — CMP14+EGFR
ALT: 22 [IU]/L (ref 0–44)
AST: 25 [IU]/L (ref 0–40)
Albumin: 4.4 g/dL (ref 3.9–4.9)
Alkaline Phosphatase: 110 [IU]/L (ref 44–121)
BUN/Creatinine Ratio: 19 (ref 10–24)
BUN: 21 mg/dL (ref 8–27)
Bilirubin Total: 0.6 mg/dL (ref 0.0–1.2)
CO2: 21 mmol/L (ref 20–29)
Calcium: 9.4 mg/dL (ref 8.6–10.2)
Chloride: 102 mmol/L (ref 96–106)
Creatinine, Ser: 1.1 mg/dL (ref 0.76–1.27)
Globulin, Total: 2.3 g/dL (ref 1.5–4.5)
Glucose: 177 mg/dL — ABNORMAL HIGH (ref 70–99)
Potassium: 5 mmol/L (ref 3.5–5.2)
Sodium: 141 mmol/L (ref 134–144)
Total Protein: 6.7 g/dL (ref 6.0–8.5)
eGFR: 72 mL/min/{1.73_m2} (ref >59)

## 2023-11-24 LAB — LIPID PANEL
Chol/HDL Ratio: 4.5 {ratio} (ref 0.0–5.0)
Cholesterol, Total: 163 mg/dL (ref 100–199)
HDL: 36 mg/dL — ABNORMAL LOW (ref >39)
LDL Chol Calc (NIH): 101 mg/dL — ABNORMAL HIGH (ref 0–99)
Triglycerides: 144 mg/dL (ref 0–149)
VLDL Cholesterol Cal: 26 mg/dL (ref 5–40)

## 2023-11-24 LAB — MICROALBUMIN / CREATININE URINE RATIO
Creatinine, Urine: 78.8 mg/dL
Microalb/Creat Ratio: 44 mg/g{creat} — ABNORMAL HIGH (ref 0–29)
Microalbumin, Urine: 34.7 ug/mL

## 2023-12-04 ENCOUNTER — Encounter: Payer: Self-pay | Admitting: Family Medicine

## 2023-12-18 ENCOUNTER — Other Ambulatory Visit: Payer: Self-pay

## 2023-12-18 DIAGNOSIS — Z87891 Personal history of nicotine dependence: Secondary | ICD-10-CM

## 2023-12-18 DIAGNOSIS — Z122 Encounter for screening for malignant neoplasm of respiratory organs: Secondary | ICD-10-CM

## 2024-01-18 ENCOUNTER — Ambulatory Visit (HOSPITAL_COMMUNITY)
Admission: RE | Admit: 2024-01-18 | Discharge: 2024-01-18 | Disposition: A | Discharge status: Home / Self Care | Source: Ambulatory Visit | Attending: Oncology | Admitting: Oncology

## 2024-01-18 DIAGNOSIS — Z122 Encounter for screening for malignant neoplasm of respiratory organs: Secondary | ICD-10-CM | POA: Diagnosis not present

## 2024-01-18 DIAGNOSIS — Z87891 Personal history of nicotine dependence: Secondary | ICD-10-CM | POA: Insufficient documentation

## 2024-02-15 ENCOUNTER — Encounter (HOSPITAL_COMMUNITY): Payer: Self-pay

## 2024-02-22 ENCOUNTER — Ambulatory Visit: Payer: Medicare Other | Admitting: Family Medicine

## 2024-03-04 ENCOUNTER — Ambulatory Visit: Admitting: Family Medicine

## 2024-03-04 ENCOUNTER — Encounter: Payer: Self-pay | Admitting: Family Medicine

## 2024-03-04 VITALS — BP 112/62 | HR 71 | Ht 70.0 in | Wt 246.0 lb

## 2024-03-04 DIAGNOSIS — E1169 Type 2 diabetes mellitus with other specified complication: Secondary | ICD-10-CM

## 2024-03-04 DIAGNOSIS — E785 Hyperlipidemia, unspecified: Secondary | ICD-10-CM | POA: Diagnosis not present

## 2024-03-04 DIAGNOSIS — I10 Essential (primary) hypertension: Secondary | ICD-10-CM

## 2024-03-04 DIAGNOSIS — Z7984 Long term (current) use of oral hypoglycemic drugs: Secondary | ICD-10-CM

## 2024-03-04 LAB — BAYER DCA HB A1C WAIVED: HB A1C (BAYER DCA - WAIVED): 7 % — ABNORMAL HIGH (ref 4.8–5.6)

## 2024-03-04 NOTE — Progress Notes (Signed)
 BP 112/62   Pulse 71   Ht 5\' 10"  (1.778 m)   Wt 246 lb (111.6 kg)   SpO2 92%   BMI 35.30 kg/m    Subjective:   Patient ID: David Tate, male    DOB: Apr 23, 1956, 68 y.o.   MRN: [id number]  HPI: David Tate is a 68 y.o. male presenting on 03/04/2024 for Medical Management of Chronic Issues, Diabetes, and Knee Pain (right)   HPI Type 2 diabetes mellitus Patient comes in today for recheck of his diabetes. Patient has been currently taking Jardiance  and metformin . Patient is currently on an ACE inhibitor/ARB. Patient has not seen an ophthalmologist this year. Patient denies any new issues with their feet. The symptom started onset as an adult htn and hld ARE RELATED TO DM   Hypertension Patient is currently on losartan , and their blood pressure today is 112/62. Patient denies any lightheadedness or dizziness. Patient denies headaches, blurred vision, chest pains, shortness of breath, or weakness. Denies any side effects from medication and is content with current medication.   Hyperlipidemia Patient is coming in for recheck of his hyperlipidemia. The patient is currently taking pravastatin . They deny any issues with myalgias or history of liver damage from it. They deny any focal numbness or weakness or chest pain.   Relevant past medical, surgical, family and social history reviewed and updated as indicated. Interim medical history since our last visit reviewed. Allergies and medications reviewed and updated.  Review of Systems  Constitutional:  Negative for chills and fever.  Eyes:  Negative for visual disturbance.  Respiratory:  Negative for shortness of breath and wheezing.   Cardiovascular:  Negative for chest pain and leg swelling.  Musculoskeletal:  Negative for back pain and gait problem.  Skin:  Negative for rash.  Neurological:  Negative for dizziness, weakness and light-headedness.  All other systems reviewed and are negative.   Per HPI unless specifically indicated  above   Allergies as of 03/04/2024       Reactions   Lisinopril Cough   Latex Hives, Itching, Rash, Swelling        Medication List        Accurate as of Mar 04, 2024  4:40 PM. If you have any questions, ask your nurse or doctor.          empagliflozin  10 MG Tabs tablet Commonly known as: Jardiance  Take 1 tablet (10 mg total) by mouth daily before breakfast.   losartan  50 MG tablet Commonly known as: COZAAR  Take 2 tablets (100 mg total) by mouth daily.   metFORMIN  500 MG 24 hr tablet Commonly known as: GLUCOPHAGE -XR Take 2 tablets (1,000 mg total) by mouth 2 (two) times daily.   pravastatin  20 MG tablet Commonly known as: PRAVACHOL  Take 1 tablet (20 mg total) by mouth daily.   Rivaroxaban  15 MG Tabs tablet Commonly known as: Xarelto  Take 1 tablet (15 mg total) by mouth daily with supper.   sertraline  50 MG tablet Commonly known as: ZOLOFT  Take 1 tablet (50 mg total) by mouth daily.   sildenafil  20 MG tablet Commonly known as: REVATIO  Take 1-3 tablets (20-60 mg total) by mouth as needed.         Objective:   BP 112/62   Pulse 71   Ht 5\' 10"  (1.778 m)   Wt 246 lb (111.6 kg)   SpO2 92%   BMI 35.30 kg/m   Wt Readings from Last 3 Encounters:  03/04/24 246 lb (111.6 kg)  11/23/23  247 lb (112 kg)  04/10/23 246 lb (111.6 kg)    Physical Exam Vitals and nursing note reviewed.  Constitutional:      General: He is not in acute distress.    Appearance: He is well-developed. He is not diaphoretic.  Eyes:     General: No scleral icterus.    Conjunctiva/sclera: Conjunctivae normal.  Neck:     Thyroid: No thyromegaly.  Cardiovascular:     Rate and Rhythm: Normal rate and regular rhythm.     Heart sounds: Normal heart sounds. No murmur heard. Pulmonary:     Effort: Pulmonary effort is normal. No respiratory distress.     Breath sounds: Normal breath sounds. No wheezing.  Musculoskeletal:        General: No swelling. Normal range of motion.      Cervical back: Neck supple.  Lymphadenopathy:     Cervical: No cervical adenopathy.  Skin:    General: Skin is warm and dry.     Findings: No rash.  Neurological:     Mental Status: He is alert and oriented to person, place, and time.     Coordination: Coordination normal.  Psychiatric:        Behavior: Behavior normal.       Assessment & Plan:   Problem List Items Addressed This Visit       Cardiovascular and Mediastinum   Hypertension     Endocrine   Type 2 diabetes mellitus with other specified complication (HCC) - Primary   Relevant Orders   Bayer DCA Hb A1c Waived (Completed)   Hyperlipidemia associated with type 2 diabetes mellitus (HCC)    A1c 7.0, blood pressure and everything else looks good today.  No change Follow up plan: Return in about 3 months (around 06/04/2024), or if symptoms worsen or fail to improve, for Diabetes and hypertension.  Counseling provided for all of the vaccine components Orders Placed This Encounter  Procedures   Bayer DCA Hb A1c Waived    Jolyne Needs, MD [facility] 03/04/2024, 4:40 PM

## 2024-04-04 ENCOUNTER — Encounter: Payer: Self-pay | Admitting: *Deleted

## 2024-04-04 NOTE — Progress Notes (Signed)
 Patient notified via mail of LDCT lung cancer screening results with recommendations to follow up in 12 months.  Also notified of incidental findings and need to follow up with PCP.  Patient's referring provider was sent a copy of results.    IMPRESSION: 1. Lung-RADS 2, benign appearance or behavior. Continue annual screening with low-dose chest CT without contrast in 12 months. 2. Liver may be mildly steatotic. 3. Aortic atherosclerosis (ICD10-I70.0). Coronary artery calcification. 4. Enlarged pulmonic trunk, indicative of pulmonary arterial hypertension. 5.  Emphysema (ICD10-J43.9).

## 2024-06-08 ENCOUNTER — Encounter: Payer: Self-pay | Admitting: Family Medicine

## 2024-06-08 ENCOUNTER — Ambulatory Visit (INDEPENDENT_AMBULATORY_CARE_PROVIDER_SITE_OTHER): Admitting: Family Medicine

## 2024-06-08 VITALS — BP 136/80 | HR 75 | Ht 70.0 in | Wt 241.0 lb

## 2024-06-08 DIAGNOSIS — R351 Nocturia: Secondary | ICD-10-CM

## 2024-06-08 DIAGNOSIS — I10 Essential (primary) hypertension: Secondary | ICD-10-CM

## 2024-06-08 DIAGNOSIS — F410 Panic disorder [episodic paroxysmal anxiety] without agoraphobia: Secondary | ICD-10-CM

## 2024-06-08 DIAGNOSIS — J40 Bronchitis, not specified as acute or chronic: Secondary | ICD-10-CM

## 2024-06-08 DIAGNOSIS — E1169 Type 2 diabetes mellitus with other specified complication: Secondary | ICD-10-CM | POA: Diagnosis not present

## 2024-06-08 DIAGNOSIS — E785 Hyperlipidemia, unspecified: Secondary | ICD-10-CM

## 2024-06-08 DIAGNOSIS — F339 Major depressive disorder, recurrent, unspecified: Secondary | ICD-10-CM

## 2024-06-08 DIAGNOSIS — Z125 Encounter for screening for malignant neoplasm of prostate: Secondary | ICD-10-CM

## 2024-06-08 LAB — LIPID PANEL

## 2024-06-08 LAB — BAYER DCA HB A1C WAIVED: HB A1C (BAYER DCA - WAIVED): 7.1 % — ABNORMAL HIGH (ref 4.8–5.6)

## 2024-06-08 MED ORDER — SERTRALINE HCL 100 MG PO TABS: 100.0000 mg | ORAL_TABLET | Freq: Every day | ORAL | 3 refills | Status: AC

## 2024-06-08 MED ORDER — METFORMIN HCL ER 500 MG PO TB24: 1000.0000 mg | ORAL_TABLET | Freq: Every day | ORAL | 3 refills | Status: AC

## 2024-06-08 MED ORDER — AMOXICILLIN-POT CLAVULANATE 875-125 MG PO TABS: 1.0000 | ORAL_TABLET | Freq: Two times a day (BID) | ORAL | 0 refills | Status: DC
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
## 2024-06-09 LAB — CBC WITH DIFFERENTIAL/PLATELET
Basophils Absolute: 0.1 x10E3/uL (ref 0.0–0.2)
Basos: 1 %
EOS (ABSOLUTE): 0.1 x10E3/uL (ref 0.0–0.4)
Eos: 2 %
Hematocrit: 52.8 % — ABNORMAL HIGH (ref 37.5–51.0)
Hemoglobin: 16.9 g/dL (ref 13.0–17.7)
Immature Grans (Abs): 0.1 x10E3/uL (ref 0.0–0.1)
Immature Granulocytes: 1 %
Lymphocytes Absolute: 1.8 x10E3/uL (ref 0.7–3.1)
Lymphs: 23 %
MCH: 28.3 pg (ref 26.6–33.0)
MCHC: 32 g/dL (ref 31.5–35.7)
MCV: 88 fL (ref 79–97)
Monocytes Absolute: 0.8 x10E3/uL (ref 0.1–0.9)
Monocytes: 10 %
Neutrophils Absolute: 5.1 x10E3/uL (ref 1.4–7.0)
Neutrophils: 63 %
Platelets: 180 x10E3/uL (ref 150–450)
RBC: 5.97 x10E6/uL — ABNORMAL HIGH (ref 4.14–5.80)
RDW: 13.1 % (ref 11.6–15.4)
WBC: 8 x10E3/uL (ref 3.4–10.8)

## 2024-06-09 LAB — CMP14+EGFR
ALT: 24 IU/L (ref 0–44)
AST: 24 IU/L (ref 0–40)
Albumin: 4.6 g/dL (ref 3.9–4.9)
Alkaline Phosphatase: 114 IU/L (ref 44–121)
BUN/Creatinine Ratio: 17 (ref 10–24)
BUN: 20 mg/dL (ref 8–27)
Bilirubin Total: 0.6 mg/dL (ref 0.0–1.2)
CO2: 23 mmol/L (ref 20–29)
Calcium: 9.3 mg/dL (ref 8.6–10.2)
Chloride: 102 mmol/L (ref 96–106)
Creatinine, Ser: 1.2 mg/dL (ref 0.76–1.27)
Globulin, Total: 2.8 g/dL (ref 1.5–4.5)
Glucose: 177 mg/dL — AB (ref 70–99)
Potassium: 4.8 mmol/L (ref 3.5–5.2)
Sodium: 142 mmol/L (ref 134–144)
Total Protein: 7.4 g/dL (ref 6.0–8.5)
eGFR: 65 mL/min/1.73 (ref >59)

## 2024-06-09 LAB — LIPID PANEL
Cholesterol, Total: 157 mg/dL (ref 100–199)
HDL: 29 mg/dL — AB (ref >39)
LDL CALC COMMENT:: 5.4 ratio — AB (ref 0.0–5.0)
LDL Chol Calc (NIH): 100 mg/dL — AB (ref 0–99)
Triglycerides: 160 mg/dL — AB (ref 0–149)
VLDL Cholesterol Cal: 28 mg/dL (ref 5–40)

## 2024-06-09 LAB — PSA, TOTAL AND FREE
PSA, Free Pct: 21.1
PSA, Free: 0.78 ng/mL
Prostate Specific Ag, Serum: 3.7 ng/mL (ref 0.0–4.0)

## 2024-06-16 ENCOUNTER — Ambulatory Visit: Payer: Self-pay | Admitting: Family Medicine

## 2024-09-12 ENCOUNTER — Ambulatory Visit: Payer: Self-pay | Admitting: Family Medicine

## 2024-09-12 ENCOUNTER — Encounter: Payer: Self-pay | Admitting: Family Medicine

## 2024-09-12 VITALS — BP 126/67 | HR 69 | Ht 70.0 in | Wt 240.0 lb

## 2024-09-12 DIAGNOSIS — I10 Essential (primary) hypertension: Secondary | ICD-10-CM

## 2024-09-12 DIAGNOSIS — E1169 Type 2 diabetes mellitus with other specified complication: Secondary | ICD-10-CM

## 2024-09-12 DIAGNOSIS — I152 Hypertension secondary to endocrine disorders: Secondary | ICD-10-CM

## 2024-09-12 DIAGNOSIS — Z86718 Personal history of other venous thrombosis and embolism: Secondary | ICD-10-CM

## 2024-09-12 DIAGNOSIS — Z23 Encounter for immunization: Secondary | ICD-10-CM

## 2024-09-12 LAB — BAYER DCA HB A1C WAIVED: HB A1C (BAYER DCA - WAIVED): 6.6 % — ABNORMAL HIGH (ref 4.8–5.6)

## 2024-09-12 MED ORDER — PRAVASTATIN SODIUM 20 MG PO TABS: 20.0000 mg | ORAL_TABLET | Freq: Every day | ORAL | 3 refills | Status: AC

## 2024-09-12 MED ORDER — EMPAGLIFLOZIN 10 MG PO TABS: 10.0000 mg | ORAL_TABLET | Freq: Every day | ORAL | 3 refills | Status: AC

## 2024-09-12 MED ORDER — LOSARTAN POTASSIUM 50 MG PO TABS: 100.0000 mg | ORAL_TABLET | Freq: Every day | ORAL | 3 refills | Status: AC

## 2024-09-12 MED ORDER — RIVAROXABAN 15 MG PO TABS: 15.0000 mg | ORAL_TABLET | Freq: Every day | ORAL | 3 refills | Status: AC

## 2024-09-12 NOTE — Progress Notes (Signed)
 BP 126/67   Pulse 69   Ht 5' 10 (1.778 m)   Wt 240 lb (108.9 kg)   SpO2 92%   BMI 34.44 kg/m    Subjective:   Patient ID: David Tate, male    DOB: March 15, 1956, 68 y.o.   MRN: [id number]  HPI: David Tate is a 68 y.o. male presenting on 09/12/2024 for Medical Management of Chronic Issues, Diabetes, and Hypertension   Discussed the use of AI scribe software for clinical note transcription with the patient, who gave verbal consent to proceed.  History of Present Illness   David Tate is a 68 year old male with diabetes who presents for a recheck of his blood sugar levels.  Glycemic control - Diabetes mellitus managed with metformin  and Jardiance  daily. - Does not perform independent blood glucose monitoring; relies on medical visits for assessment. - Last recorded hemoglobin A1c was 7.2%. - Currently awaiting new laboratory results.  Anticoagulation therapy - On Xarelto  for several years for anticoagulation. - No history of bleeding or blood in stool. - Previously used Coumadin; switched to Xarelto  due to insurance coverage. - Stable on current regimen.  Hyperlipidemia management - On pravastatin  daily for cholesterol control.  Hypertension management - On losartan  for blood pressure control. - Recent blood pressure reading was 126/67 mmHg.  Depression Zoloft  has been doing a lot better since we increased it.  He feels like things are going well and he is able to deal with the loss of his son a little bit better through this.         Relevant past medical, surgical, family and social history reviewed and updated as indicated. Interim medical history since our last visit reviewed. Allergies and medications reviewed and updated.  Review of Systems  Constitutional:  Negative for chills and fever.  Eyes:  Negative for visual disturbance.  Respiratory:  Negative for shortness of breath and wheezing.   Cardiovascular:  Negative for chest pain and leg swelling.  Skin:   Negative for rash.  Neurological:  Negative for dizziness and light-headedness.  Psychiatric/Behavioral:  Positive for dysphoric mood. Negative for self-injury, sleep disturbance and suicidal ideas. The patient is not nervous/anxious.   All other systems reviewed and are negative.   Per HPI unless specifically indicated above   Allergies as of 09/12/2024       Reactions   Lisinopril Cough   Latex Hives, Itching, Rash, Swelling        Medication List        Accurate as of September 12, 2024 11:12 AM. If you have any questions, ask your nurse or doctor.          STOP taking these medications    amoxicillin -clavulanate 875-125 MG tablet Commonly known as: AUGMENTIN  Stopped by: Fonda LABOR Nachum Derossett       TAKE these medications    empagliflozin  10 MG Tabs tablet Commonly known as: Jardiance  Take 1 tablet (10 mg total) by mouth daily before breakfast.   losartan  50 MG tablet Commonly known as: COZAAR  Take 2 tablets (100 mg total) by mouth daily.   metFORMIN  500 MG 24 hr tablet Commonly known as: GLUCOPHAGE -XR Take 2 tablets (1,000 mg total) by mouth daily with breakfast.   pravastatin  20 MG tablet Commonly known as: PRAVACHOL  Take 1 tablet (20 mg total) by mouth daily.   Rivaroxaban  15 MG Tabs tablet Commonly known as: Xarelto  Take 1 tablet (15 mg total) by mouth daily with supper.   sertraline  100 MG tablet Commonly  known as: ZOLOFT  Take 1 tablet (100 mg total) by mouth daily.         Objective:   BP 126/67   Pulse 69   Ht 5' 10 (1.778 m)   Wt 240 lb (108.9 kg)   SpO2 92%   BMI 34.44 kg/m   Wt Readings from Last 3 Encounters:  09/12/24 240 lb (108.9 kg)  06/08/24 241 lb (109.3 kg)  03/04/24 246 lb (111.6 kg)    Physical Exam Vitals and nursing note reviewed.  Constitutional:      General: He is not in acute distress.    Appearance: He is well-developed. He is not diaphoretic.  Eyes:     General: No scleral icterus.    Conjunctiva/sclera:  Conjunctivae normal.  Neck:     Thyroid: No thyromegaly.  Cardiovascular:     Rate and Rhythm: Normal rate and regular rhythm.     Heart sounds: Normal heart sounds. No murmur heard. Pulmonary:     Effort: Pulmonary effort is normal. No respiratory distress.     Breath sounds: Normal breath sounds. No wheezing.  Musculoskeletal:        General: No swelling.     Cervical back: Neck supple.  Lymphadenopathy:     Cervical: No cervical adenopathy.  Skin:    General: Skin is warm and dry.     Findings: No rash.  Neurological:     Mental Status: He is alert and oriented to person, place, and time.     Coordination: Coordination normal.  Psychiatric:        Behavior: Behavior normal.    Physical Exam   VITALS: BP- 126/67 CHEST: Lungs clear to auscultation bilaterally. CARDIOVASCULAR: Regular rate and rhythm, no murmurs. Good peripheral pulses.         Assessment & Plan:   Problem List Items Addressed This Visit       Cardiovascular and Mediastinum   Hypertension associated with diabetes (HCC)   Relevant Medications   empagliflozin  (JARDIANCE ) 10 MG TABS tablet   losartan  (COZAAR ) 50 MG tablet   pravastatin  (PRAVACHOL ) 20 MG tablet   Rivaroxaban  (XARELTO ) 15 MG TABS tablet     Endocrine   Type 2 diabetes mellitus with other specified complication (HCC) - Primary   Relevant Medications   empagliflozin  (JARDIANCE ) 10 MG TABS tablet   losartan  (COZAAR ) 50 MG tablet   pravastatin  (PRAVACHOL ) 20 MG tablet   Other Relevant Orders   Bayer DCA Hb A1c Waived   Hyperlipidemia associated with type 2 diabetes mellitus (HCC)   Relevant Medications   empagliflozin  (JARDIANCE ) 10 MG TABS tablet   losartan  (COZAAR ) 50 MG tablet   pravastatin  (PRAVACHOL ) 20 MG tablet   Rivaroxaban  (XARELTO ) 15 MG TABS tablet     Other   History of DVT (deep vein thrombosis)   Relevant Medications   Rivaroxaban  (XARELTO ) 15 MG TABS tablet   Other Visit Diagnoses       Primary hypertension        Relevant Medications   losartan  (COZAAR ) 50 MG tablet   pravastatin  (PRAVACHOL ) 20 MG tablet   Rivaroxaban  (XARELTO ) 15 MG TABS tablet          Type 2 diabetes mellitus Previous HbA1c 7.2%. On metformin  and Jardiance . No recent blood glucose monitoring. - Checked HbA1c for glycemic control. - A1c is 6.6 today, looks a lot better.  Essential hypertension BP 126/67 mmHg.  Hyperlipidemia Managed with pravastatin . No issues reported.  Anticoagulation for history of venous thrombosis/embolism Managed  with Xarelto . No bleeding or adverse effects. Transitioned from Coumadin to Xarelto  for ease and coverage. Discussed Xarelto  and Eliquis limitations with mechanical heart valves. - Continue Xarelto  for anticoagulation.          Follow up plan: Return in about 3 months (around 12/11/2024), or if symptoms worsen or fail to improve, for Diabetes .  Counseling provided for all of the vaccine components Orders Placed This Encounter  Procedures   Bayer DCA Hb A1c Waived    Fonda Levins, MD [facility] 09/12/2024, 11:12 AM

## 2024-10-14 ENCOUNTER — Other Ambulatory Visit: Payer: Self-pay

## 2024-10-14 DIAGNOSIS — Z87891 Personal history of nicotine dependence: Secondary | ICD-10-CM

## 2024-10-14 DIAGNOSIS — Z122 Encounter for screening for malignant neoplasm of respiratory organs: Secondary | ICD-10-CM

## 2024-12-15 ENCOUNTER — Ambulatory Visit: Admitting: Family Medicine
